# Patient Record
Sex: Female | Born: 1945
Health system: Southern US, Community
[De-identification: ages and names within clinical notes are randomized; demographics above are authoritative.]

## PROBLEM LIST (undated history)

## (undated) DIAGNOSIS — L661 Lichen planopilaris, unspecified: Secondary | ICD-10-CM

## (undated) DIAGNOSIS — M5136 Other intervertebral disc degeneration, lumbar region: Secondary | ICD-10-CM

## (undated) DIAGNOSIS — E079 Disorder of thyroid, unspecified: Secondary | ICD-10-CM

## (undated) DIAGNOSIS — L439 Lichen planus, unspecified: Secondary | ICD-10-CM

## (undated) DIAGNOSIS — M1990 Unspecified osteoarthritis, unspecified site: Secondary | ICD-10-CM

## (undated) DIAGNOSIS — L900 Lichen sclerosus et atrophicus: Secondary | ICD-10-CM

## (undated) DIAGNOSIS — M51369 Other intervertebral disc degeneration, lumbar region without mention of lumbar back pain or lower extremity pain: Secondary | ICD-10-CM

## (undated) DIAGNOSIS — J45909 Unspecified asthma, uncomplicated: Secondary | ICD-10-CM

## (undated) HISTORY — PX: SINUSOTOMY: SHX291

## (undated) HISTORY — PX: DILATION AND CURETTAGE OF UTERUS: SHX78

---

## 1998-02-16 DIAGNOSIS — H9190 Unspecified hearing loss, unspecified ear: Secondary | ICD-10-CM | POA: Insufficient documentation

## 2005-10-13 ENCOUNTER — Other Ambulatory Visit: Admission: RE | Admit: 2005-10-13 | Discharge: 2005-10-13 | Payer: Self-pay | Admitting: *Deleted

## 2005-11-03 ENCOUNTER — Encounter: Admission: RE | Admit: 2005-11-03 | Discharge: 2005-11-03 | Payer: Self-pay | Admitting: *Deleted

## 2005-12-01 ENCOUNTER — Encounter: Admission: RE | Admit: 2005-12-01 | Discharge: 2005-12-01 | Payer: Self-pay | Admitting: *Deleted

## 2006-11-08 ENCOUNTER — Encounter: Admission: RE | Admit: 2006-11-08 | Discharge: 2006-11-08 | Payer: Self-pay | Admitting: *Deleted

## 2007-02-22 ENCOUNTER — Encounter: Admission: RE | Admit: 2007-02-22 | Discharge: 2007-02-22 | Payer: Self-pay | Admitting: Gastroenterology

## 2007-11-22 ENCOUNTER — Encounter: Admission: RE | Admit: 2007-11-22 | Discharge: 2007-11-22 | Payer: Self-pay | Admitting: Family Medicine

## 2007-11-26 ENCOUNTER — Inpatient Hospital Stay (HOSPITAL_COMMUNITY): Admission: EM | Admit: 2007-11-26 | Discharge: 2007-11-27 | Payer: Self-pay | Admitting: Emergency Medicine

## 2008-11-29 ENCOUNTER — Encounter: Admission: RE | Admit: 2008-11-29 | Discharge: 2008-11-29 | Payer: Self-pay | Admitting: Obstetrics

## 2009-09-03 IMAGING — CR DG SHOULDER 2+V*R*
3 series · 3 of 3 positions shown · non-contrast
Comparison: Two-view chest obtained at the same time.

CLINICAL DATA: Right shoulder pain following a fall down steps.

RIGHT SHOULDER - 2+ VIEW

[w shoulder ap internal righ]
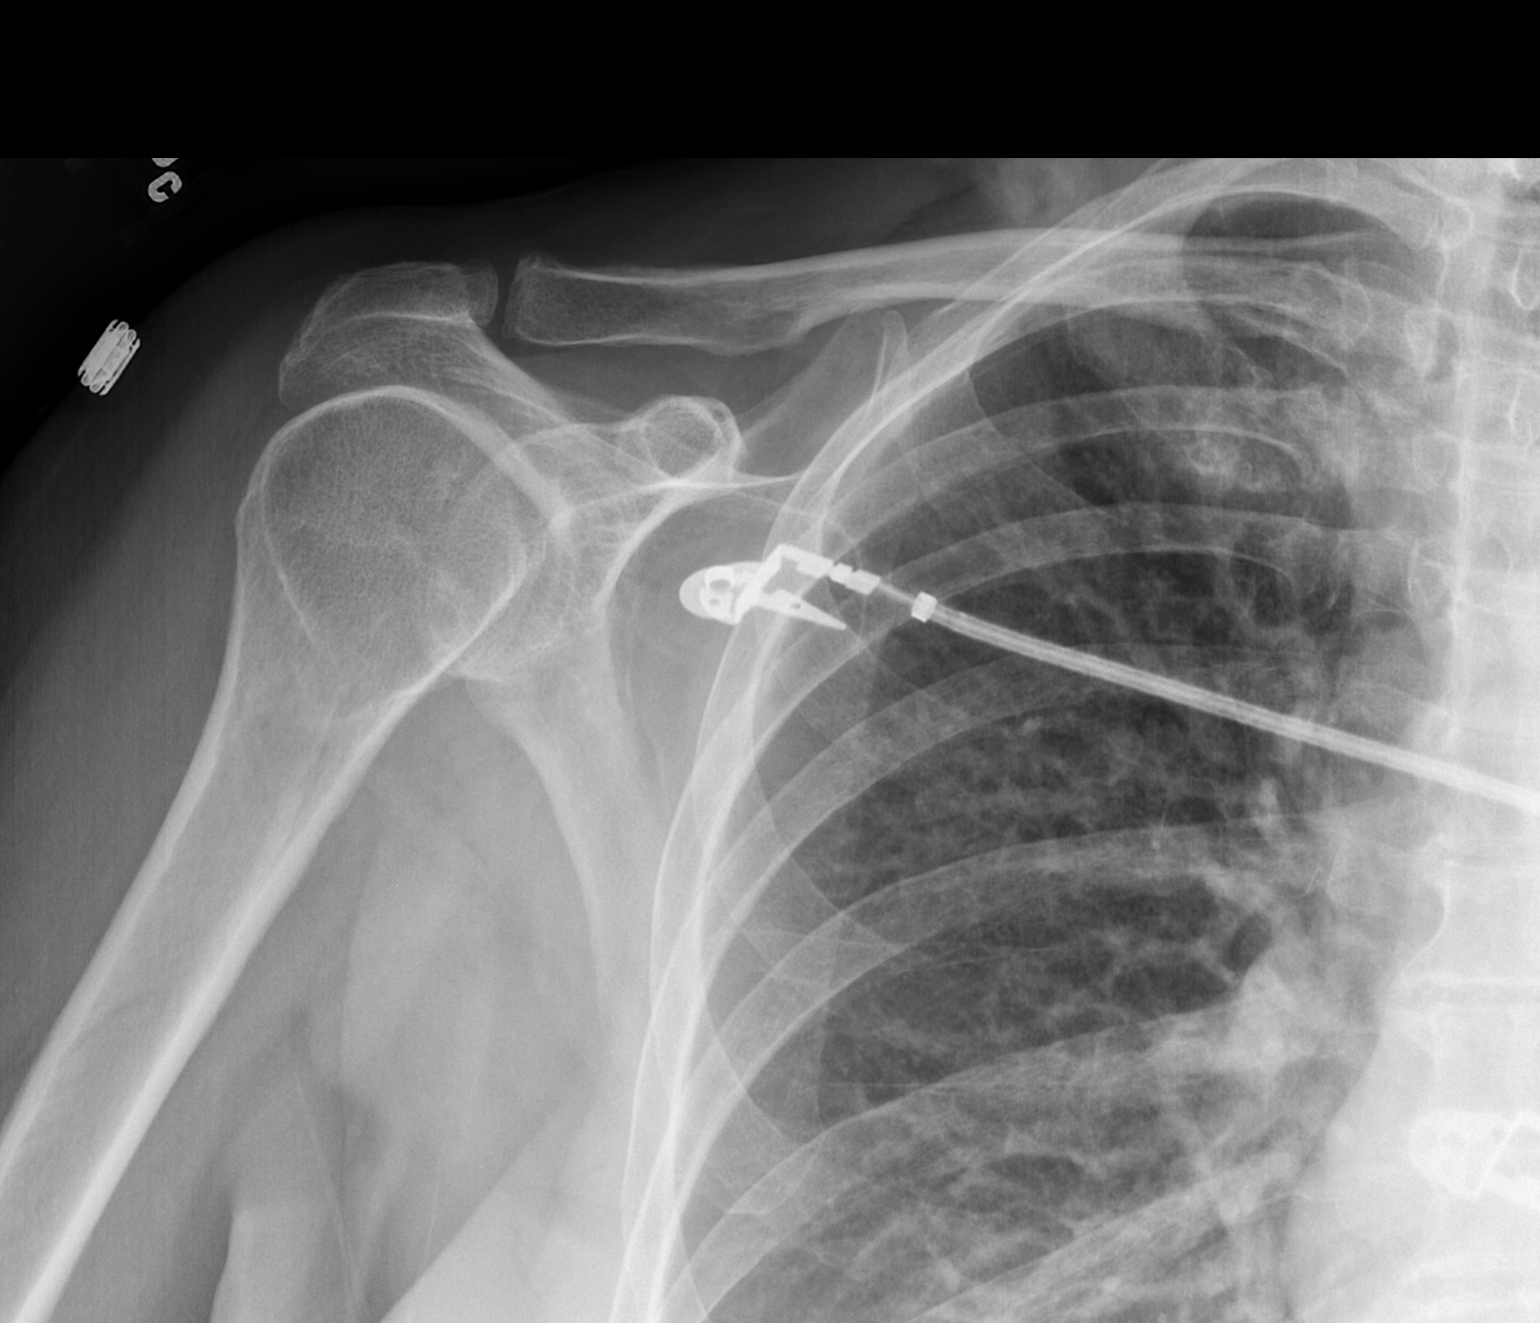

[w shoulder ap external righ]
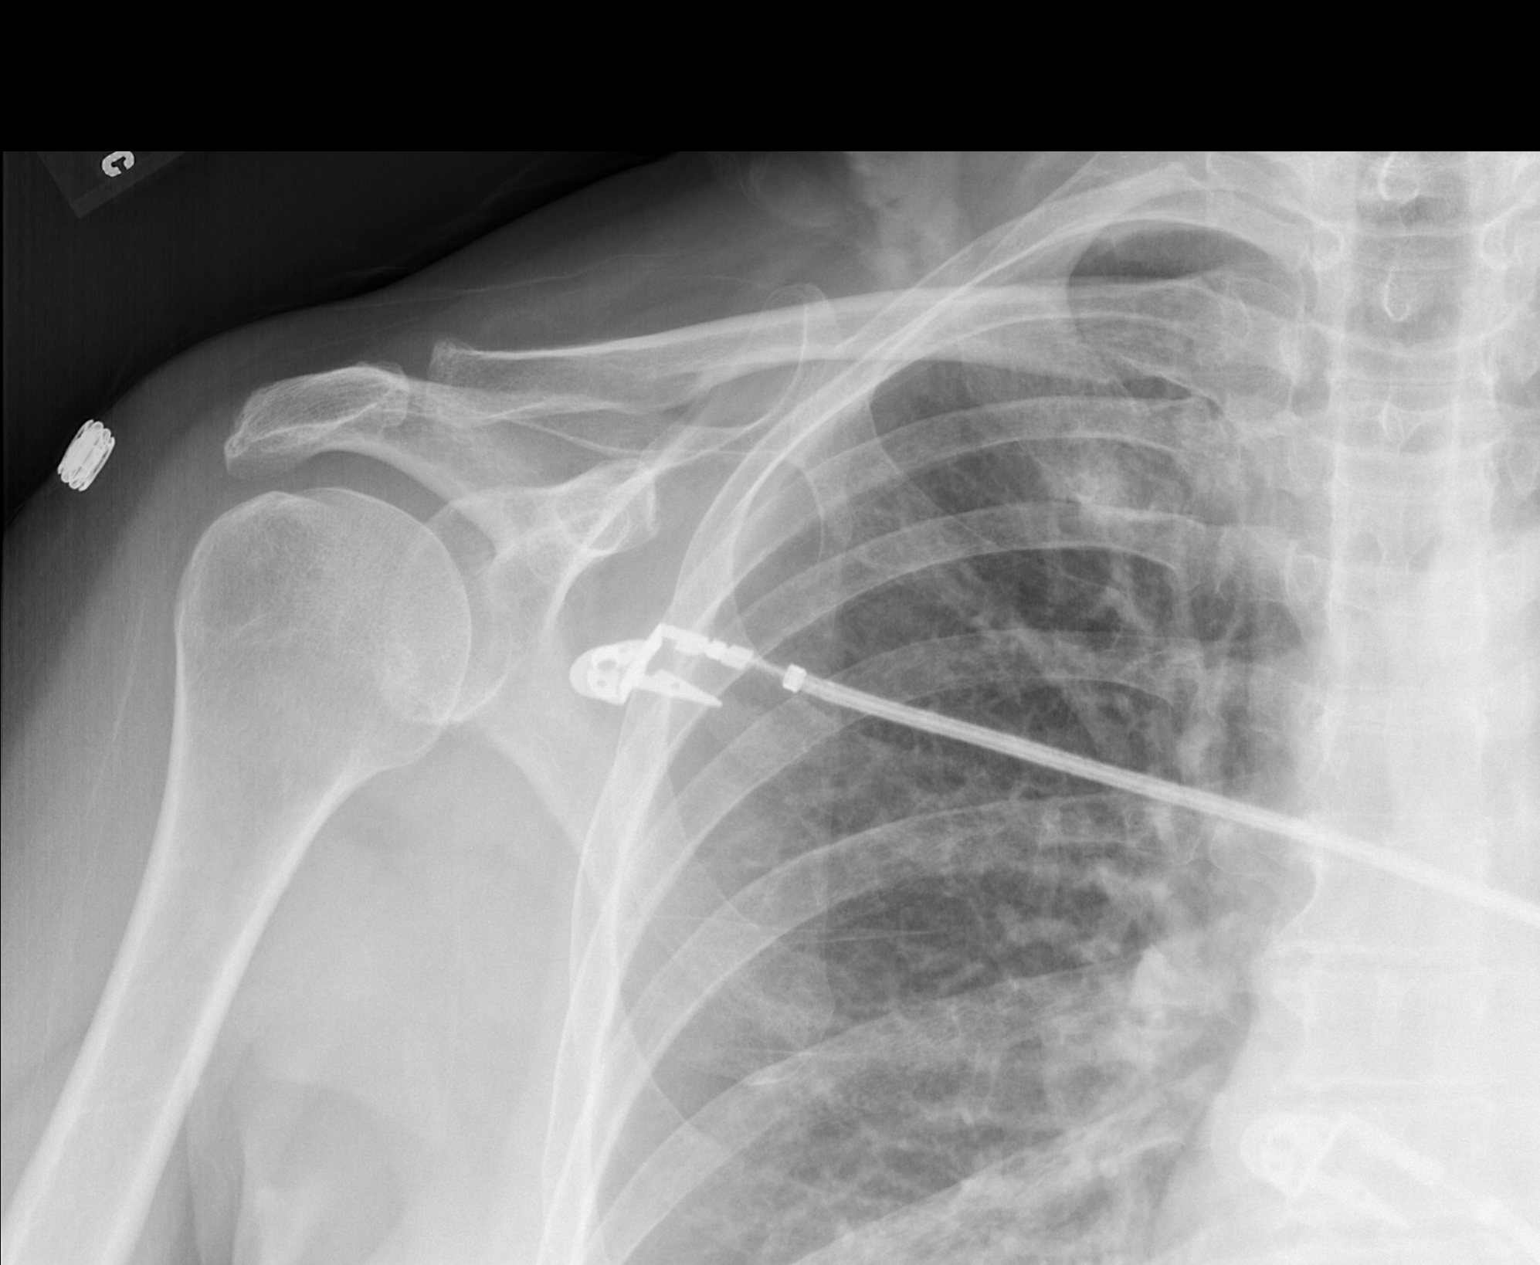

[w shoulder y view right]
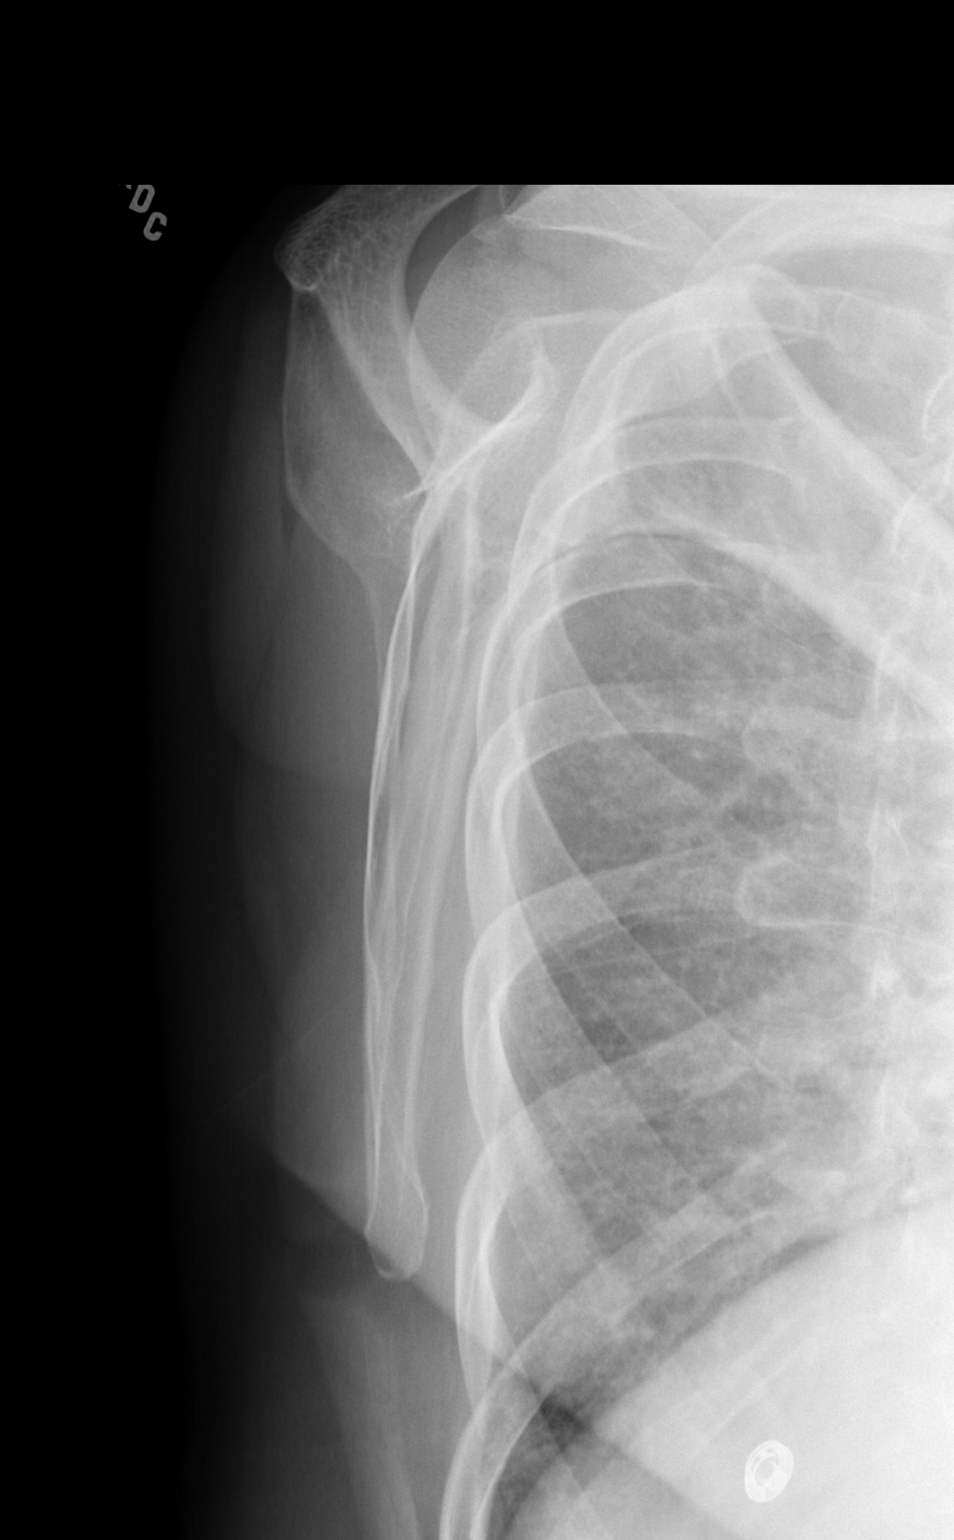

[3 of 3 positions shown; findings below may reference images not displayed]

FINDINGS: Normal appearing bones and soft tissues with no fracture
or dislocation seen.
IMPRESSION: No fracture or dislocation.

## 2009-09-03 IMAGING — CR DG HAND COMPLETE 3+V*L*
3 series · 3 of 3 positions shown · non-contrast
Comparison: None.

CLINICAL DATA: Left hand pain after falling down stairs.

LEFT HAND - COMPLETE 3+ VIEW

[w hand pa left *]
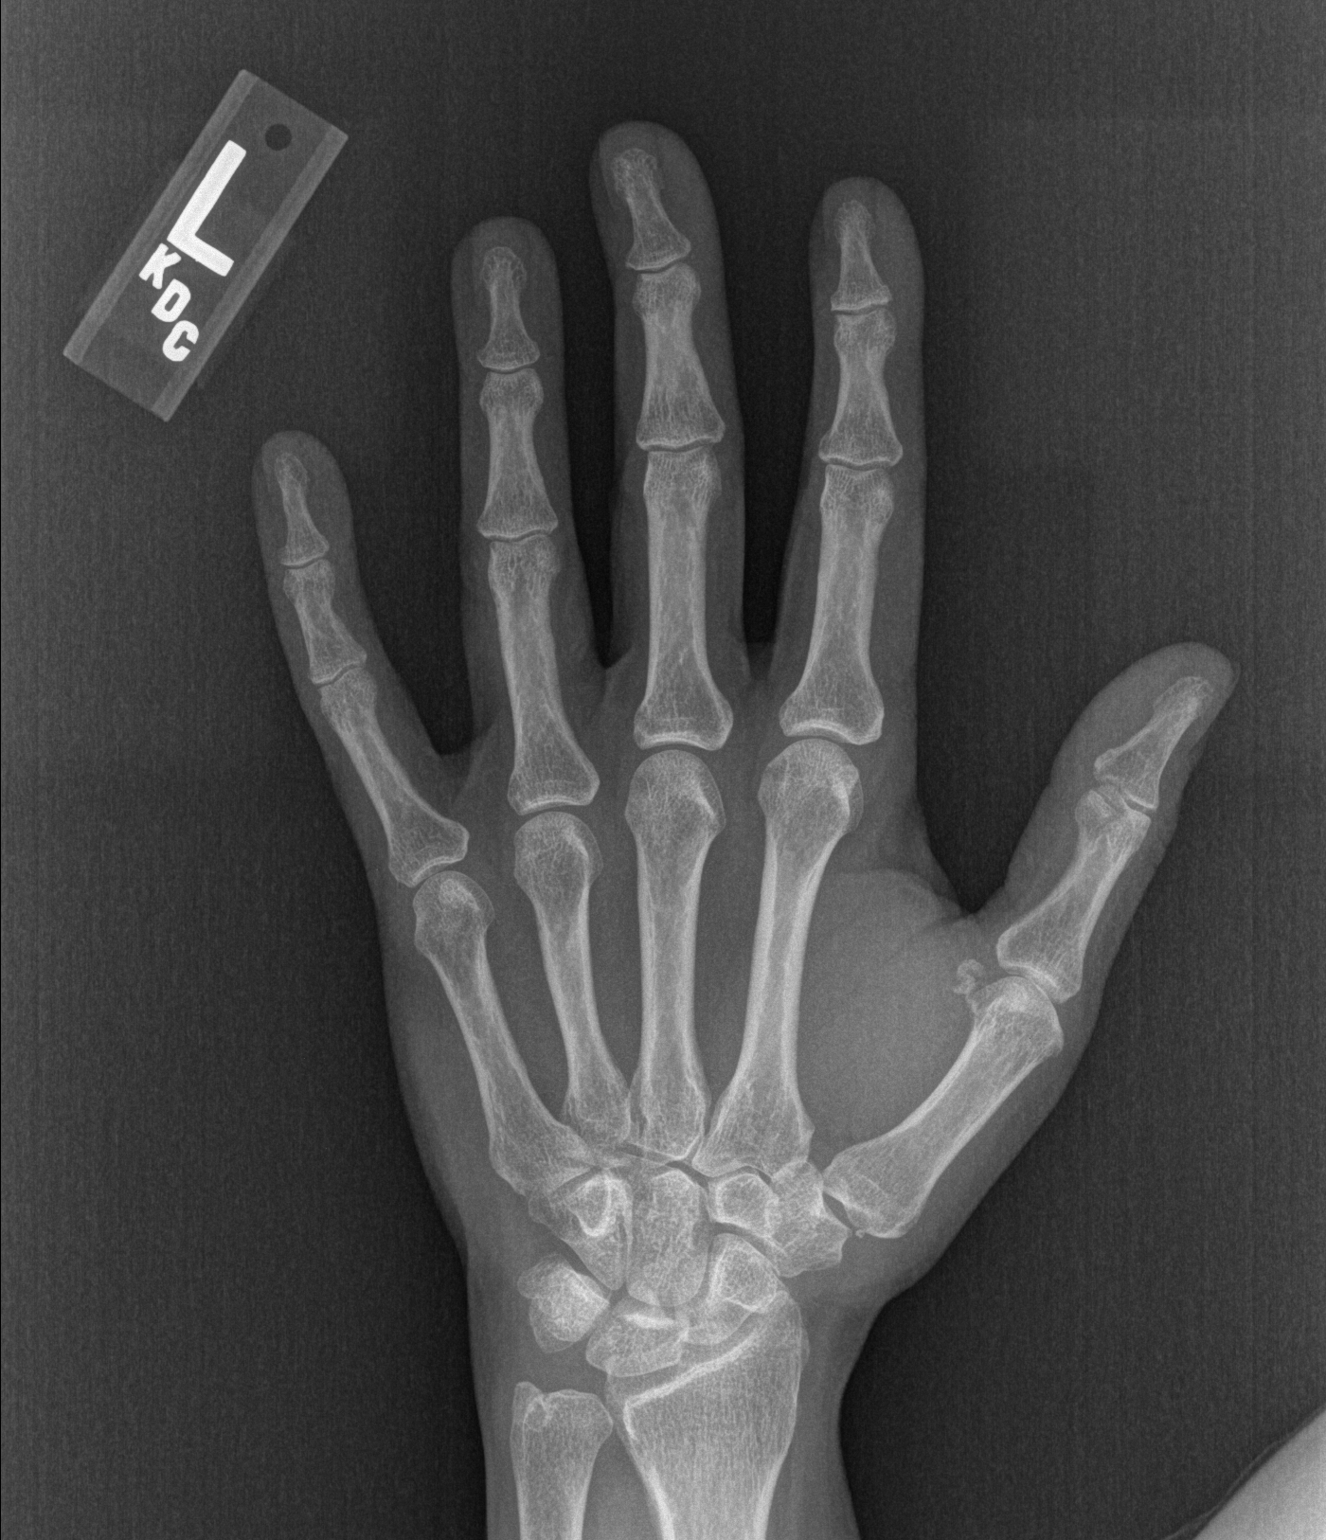

[w hand oblique left *]
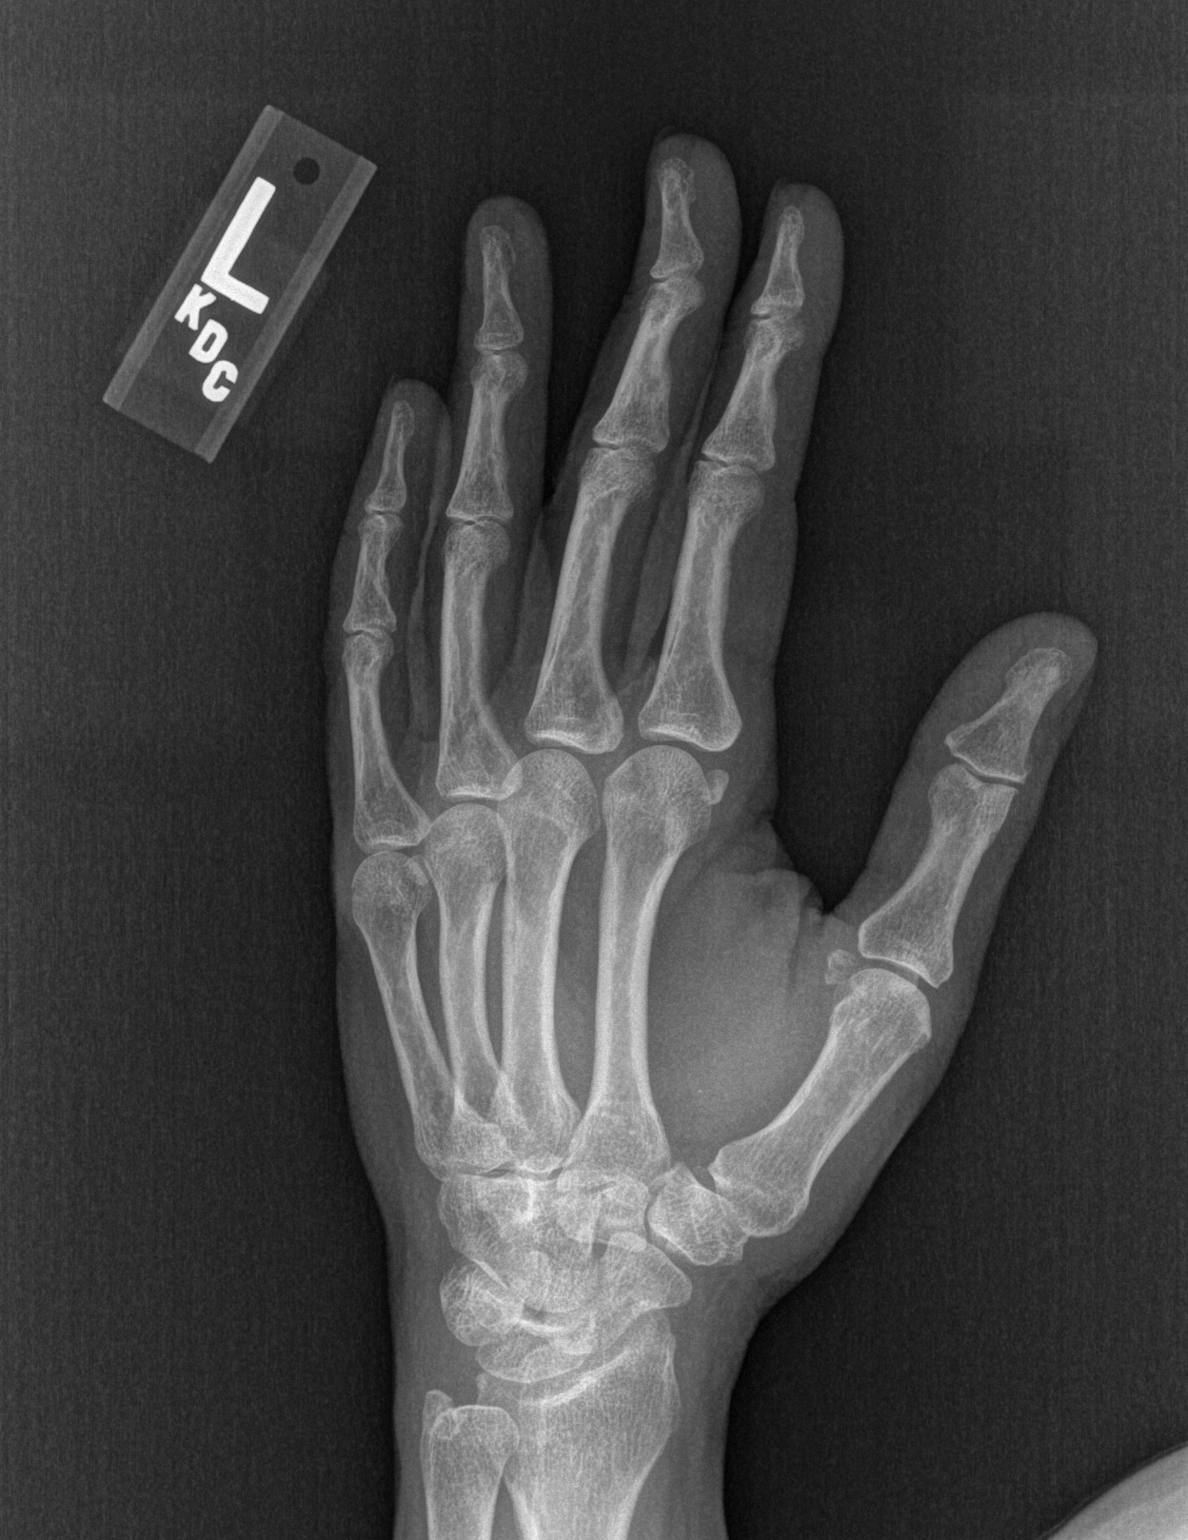

[w hand lat left *]
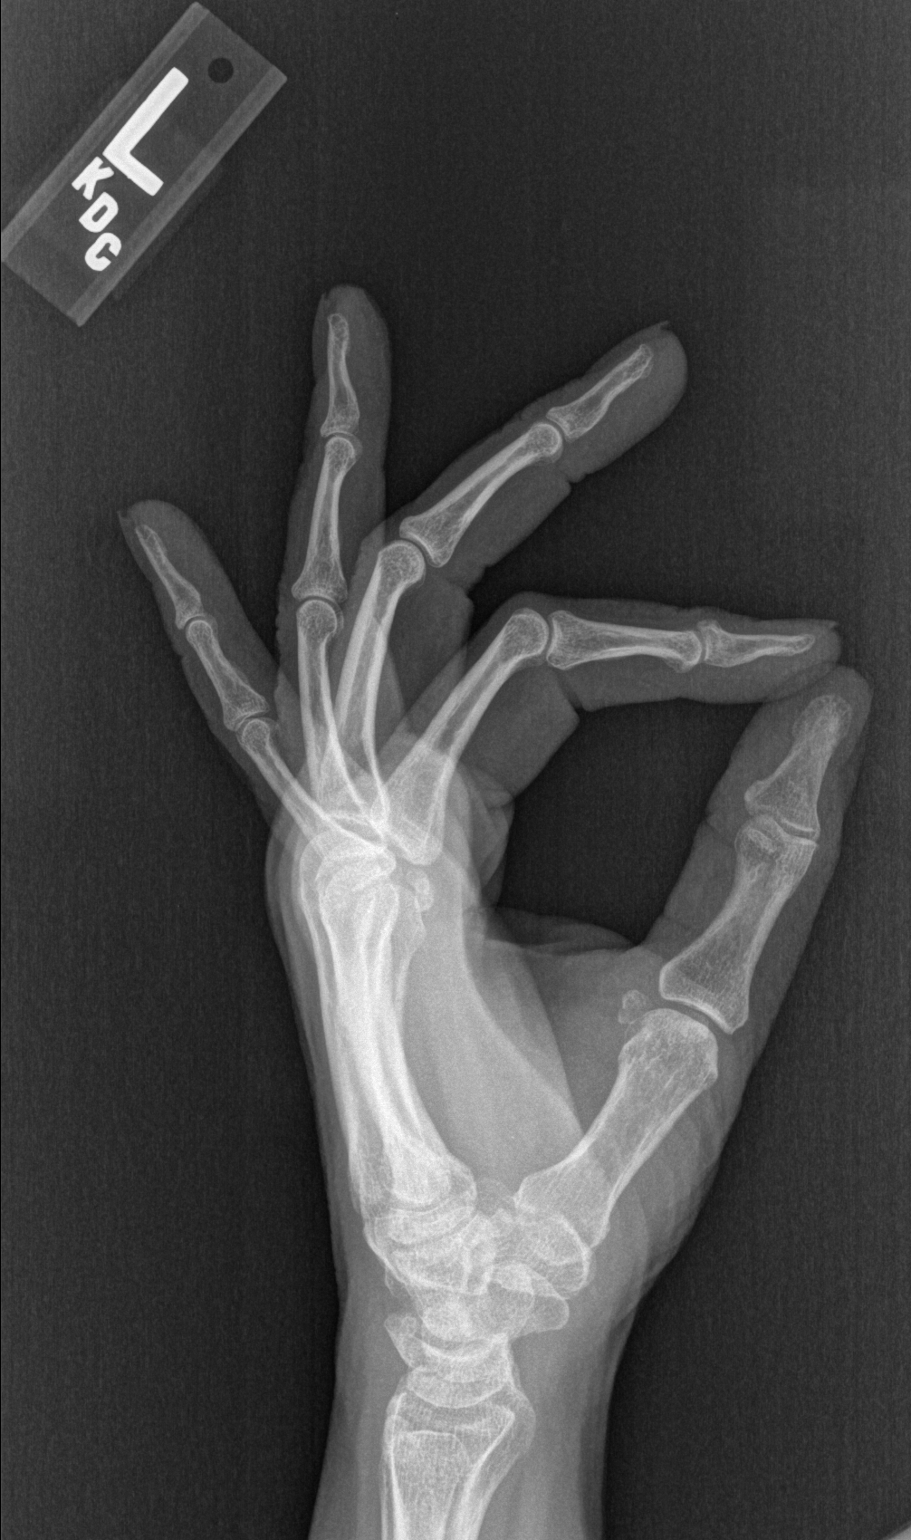

[3 of 3 positions shown; findings below may reference images not displayed]

FINDINGS: Diffuse osteopenia.  A dorsal carpal avulsion fracture is
difficult to exclude on the lateral view, due to overlapping bones
and osteopenia.  Otherwise, no fracture or dislocation is seen.
IMPRESSION: A dorsal carpal avulsion fracture cannot be excluded.  Correlation
with presence or absence of pain at that location is necessary.
Otherwise, no fracture seen.

## 2009-12-02 ENCOUNTER — Encounter: Admission: RE | Admit: 2009-12-02 | Discharge: 2009-12-02 | Payer: Self-pay | Admitting: Family Medicine

## 2010-02-06 ENCOUNTER — Encounter
Admission: RE | Admit: 2010-02-06 | Discharge: 2010-02-06 | Payer: Self-pay | Source: Home / Self Care | Attending: Obstetrics | Admitting: Obstetrics

## 2010-03-08 ENCOUNTER — Encounter: Payer: Self-pay | Admitting: *Deleted

## 2010-03-09 ENCOUNTER — Encounter: Payer: Self-pay | Admitting: *Deleted

## 2010-07-01 NOTE — H&P (Signed)
Melissa Craig, ULIBARRI              ACCOUNT NO.:  [id number]   MEDICAL RECORD NO.:  [id number]          PATIENT TYPE:  INP   LOCATION:  3738                         FACILITY:  [facility]   PHYSICIAN:  Kela Millin, M.D.DATE OF BIRTH:  [birth year]   DATE OF ADMISSION:  11/26/2007  DATE OF DISCHARGE:                              HISTORY & PHYSICAL   PRIMARY CARE PHYSICIAN:  Dwana Curd. Para March, M.D.   CHIEF COMPLAINT:  Syncope and status post fall.   HISTORY OF PRESENT ILLNESS:  The patient is a 65 year old white female  with history of asthma and hypothyroidism who presents with the above  complaints.  She states that she was on the phone talking to a friend  and the next thing she knew she was waking up on the floor at the bottom  of the staircase.  She has no memory of what actually happened, but  after she awakened on the floor and found that she had fallen, she was  able to reach her phone and called the neighbors. The friend she was  talking to on the phone when this happened reports that they were just  talking and the next thing she knew the patient was quiet and then she  heard a loud noise.  They estimate that she was unconscious just for  seconds because of the time the neighbor reports that she called her  after the fall.  Her friends report that she appeared to be disoriented  for about 20 to 25 minutes after the fall. No urinary or stool  incontinence reported.  She denies chest pain, dizziness, fevers, cough,  palpitations, focal weakness, headaches, and no blurry vision.  She  sustained some lacerations to her face following the fall and was  bleeding from her forehead upon arrival in the ER, also some lacerations  to her lower lip.   Upon arrival in the ER, a CT scan of her head was done which showed no  skull fracture or intracranial hemorrhage, mild chronic bilateral  ethmoid, frontal and sphenoid sinusitis noted.  CT scan of cervical  spine also done with no fracture  or subluxation.  She also was  complaining of pain and discoloration in her left thumb area and an x-  ray was done and per radiologist a dorsal carpal avulsion fracture  cannot be excluded.  A right shoulder x-ray was also done with no  fracture or dislocation.  ENT was consulted for the facial lacerations  and Dr. Lazarus Salines saw the patient and sutured the facial lacerations.  The  trauma surgery was also consulted and they saw the patient and following  evaluation recommended that she be admitted to the medicine service.   PAST MEDICAL HISTORY:  As above.   MEDICATIONS:  1. Synthroid 0.112 mg daily.  2. Asmanex daily.  3. Nasonex daily.  4. Multivitamin.  5. Citracal Plus D.   ALLERGIES:  AMPICILLIN, GENTAMICIN, SULFA, PENICILLIN.   SOCIAL HISTORY:  She quit tobacco in 1982.  She drinks a glass of wine  daily.   FAMILY HISTORY:  Mother had a stroke.   REVIEW  OF SYSTEMS:  As per HPI, other review of systems negative.   PHYSICAL EXAMINATION:  GENERAL:  The patient is an older white female.  She is alert and oriented in no apparent distress.  VITAL SIGNS:  Initial blood pressure 113/61, dropped to 96/64 after she  was given morphine in the ER.  Pulse 77, respiratory rate 16, O2  saturation 98%.  HEENT:  Facial lacerations over her nasal bridge as well as laceration  on her lower lip, status post sutures,  bruising on her chin as well.  NECK:  Supple.  No adenopathy, no thyromegaly, and no JVD.  LUNGS:  Clear to auscultation bilaterally.  No crackles or wheezes.  CARDIOVASCULAR:  Regular rate and rhythm.  Normal S1 and S2.  ABDOMEN:  Soft.  Bowel sounds present. Nontender, nondistended.  No  organomegaly and no masses palpable.  EXTREMITIES:  No cyanosis and no edema.  NEUROLOGIC:  She is alert and oriented x3.  Cranial nerves II-XII  grossly intact.  Strength was 4-5/5 and symmetric.  Plantar reflexes:  Her toes downgoing bilaterally.  Sensory grossly intact.   LABORATORY  DATA:  The CT scan of her head and the other imaging studies  as per HPI.  A white cell count is 11 with a hemoglobin of 12.9,  hematocrit of 38.2, platelet count 237,000.  Sodium 135 with a potassium  of 4.2, chloride 103, CO2 27, glucose 166, BUN 17, creatinine 0.89,  calcium 9.  Point-of-care markers negative x1.   ASSESSMENT AND PLAN:  1. Syncope, status post fall with facial lacerations as discussed      above.  CT scan of head negative for acute intracranial findings      and also CT of neck negative for fracture.  The patient seen by      trauma surgery as well as ENT as discussed above.  Will obtain      orthostatic vital signs, carotid Doppler ultrasound, 2-D echo and      follow.  2. Facial lacerations, status post suturing per ENT.  Follow up at his      office in 7 to 8 days as directed, local wound care as instructed.  3. Mild sinusitis.  Continue Flonase.  4. Left hand bruising.  Hand x-ray reviewed per trauma surgery and      they indicate no fracture.  5. Hypothyroidism.  Continue Synthroid.  6. History of asthma.  Continue outpatient medications.      Kela Millin, M.D.  Electronically Signed     ACV/MEDQ  D:  11/26/2007  T:  11/27/2007  Job:  [id number]   cc:   Dwana Curd. Para March, M.D.

## 2010-07-01 NOTE — Consult Note (Signed)
Melissa Craig, HANLINE NO.:  [id number]   MEDICAL RECORD NO.:  [id number]          PATIENT TYPE:  INP   LOCATION:  1828                         FACILITY:  [facility]   PHYSICIAN:  Sandria Bales. Ezzard Standing, M.D.  DATE OF BIRTH:  01-30-1946   DATE OF CONSULTATION:  DATE OF DISCHARGE:                                 CONSULTATION   Date of consultation ??   REFERRING PHYSICIAN:  [name], M.D.   REASON FOR REFERRAL:  Potential trauma patient with fall, laceration across bridge of nose,  questionable closed head injury.   HISTORY OF PRESENT ILLNESS:  This is a 65 year old white female who is a  patient of Dr. Conard Craig, [facility], who was at  home today talking on the phone to her niece when she passed out.  Apparently she fell down some steps, sustained a laceration at the  bridge of her nose, was brought in to the emergency room.  On arrival at  the emergency room she apparently was alert, oriented, with stable vital  signs, though she has amnesia for immediately before and after the  accident.  She was not coded as a trauma patient.  Her GCS is 15.   She denies any history of previous neurologic events.  She has had no  history of seizure, no loss of consciousness, no previous syncopal  episodes.  She is also not sure whether she tripped and fell versus  syncope.  She has amnesia for the event.   PAST MEDICAL HISTORY:   ALLERGIES:  1. PENICILLIN.  She says it made  her itch, last used over 30 years      ago.  2. GENTAMICIN which she used for her sinuses.  3. NIACIN which she is unsure what that is.  4. SULFA which was last used about 10 years ago.  Itching.   CURRENT MEDICATIONS:  1. Synthroid 112 mcg daily.  2. Asmanex inhaler.  3. Nasonex.  4. She is on vitamins.   REVIEW OF SYSTEMS:  NEUROLOGIC:  See history of present illness.  PULMONARY:  No history of pneumonia.  She does have some mild asthma.  She has never been hospitalized for  this.  This is well-controlled with  her Asmanex.  CARDIAC:  She has had no heart disease, chest pain, or  hypertension.  She has had no cardiac catheterization.  GASTROINTESTINAL:  No history of peptic ulcer disease or liver disease  or pancreatic disease.  UROLOGIC:  She has tendency to kidney infections.  MUSCULOSKELETAL:  She has had no previous injuries or fractures.   She is accompanied in the room with her niece, who she apparently was  talking on the phone with.  She is single and divorced many years ago.  She does not have children.  She is retired from [employer] working for [employer] and [employer].   PHYSICAL EXAMINATION:  VITAL SIGNS:  On physical exam her temperature is  97.9, blood pressure 113/61, pulse 58, respirations are 18.  GENERAL:  She is a well-nourished white female who is alert and  cooperative  and talking and oriented x3.  She has no mental sequelae  from the syncopal episode.  HEENT:  She has a 4 cm laceration about the level of eyebrow at the  bridge of her nose.  Her pupils are equal and reactive to light.  She has gross vision  intact.  Her nose appears to be midline.  Teeth:  There are no obvious  fractures of the teeth, though she does have an abrasion/cut to her  upper lip and lower lip.  Her tympanic membranes are unremarkable as are  her auditory canals.  EXTREMITIES:  She complains of right shoulder soreness without obvious  fracture.  She had a little bruise over her left hand and thumb but no  point tenderness.  She has good strength in upper and lower extremities  and no obvious lower extremity injury.  NECK:  She has no pain on palpation of her neck or moving her neck.  Her  thoracic and lumbar spines are also negative.  LUNGS:  Her lungs are clear to auscultation with symmetric breath  sounds.  HEART:  Has a regular rate and rhythm without murmur or rub.  ABDOMEN:  Soft.  She has no tenderness nor obvious contusion, bruise,  or  injury.  EXTREMITIES:  She has no obvious long bone injury. She does have the  right shoulder soreness.   I reviewed her CT scans of her head and face with Dr. Winferd Humphrey, head  and neck and these showed some mild cerebral atrophy, degenerative  disease over anterior cervical spine, but no obvious skull/neck fracture  or injury to her brain.   LABORATORY DATA:  Her labs show a hemoglobin of 12.9, hematocrit 38.2,  white blood count 11,000, platelet count 237,000.  Her sodium was 135,  potassium 4.2, CO2 27, creatinine 0.89.   Her shoulder films on the right side were negative.  Her left hand films  were negative for any obvious fracture.   IMPRESSION:  1. Closed head injury, loss of consciousness.  Plan to be admitted by      internal medicine for evaluation of her syncope.  2. Laceration of the bridge of her nose and upper and lower lips for      evaluation by Dr. Flo Shanks.  3. Right shoulder soreness with no obvious fracture.  4. Left hand bruise, no obvious fracture.  5. Mild asthma, well-controlled.  6. Thyroid replacement.  7. She was planning to travel with her friend [name] to [place] tomorrow.      Sandria Bales. Ezzard Standing, M.D.  Electronically Signed     DHN/MEDQ  D:  11/26/2007  T:  11/26/2007  Job:  [id number]   cc:   Brett Albino, M.D.  Gloris Manchester. Lazarus Salines, M.D.  Roselyn M. Willa Rough, M.D.  Jettie Pagan, MD

## 2010-07-01 NOTE — Consult Note (Signed)
NAMEKRYSTELLE, PRASHAD NO.:  [id number]   MEDICAL RECORD NO.:  [id number]          PATIENT TYPE:  INP   LOCATION:  3738                         FACILITY:  [facility]   PHYSICIAN:  Zola Button T. Lazarus Salines, M.D. DATE OF BIRTH:  06-Apr-1945   DATE OF CONSULTATION:  11/26/2007  DATE OF DISCHARGE:                                 CONSULTATION   CHIEF COMPLAINT:  Facial injuries.   HISTORY:  This 65 year old white female was walking down the stairs and  went up to the bottom with no recollection of how that happened.  She  sustained lacerations of the brow, upper and lower lips.  CT scan of the  head shows no acute fractures.  There are changes consistent with  chronic sinus disease and prior surgery.  No intracranial findings.  ENT  was called for assistance in closing lacerations.  The patient is not  clear as to whether she fainted.  She has no history of cardiac  arrhythmias, hypoglycemia, or any neurologic events.   PAST MEDICAL HISTORY:  She is allergic to AMPICILLIN, GENTAMICIN, and  SULFA.  She has history of asthma and thyroid problems.  Medications  include Asmanex, Synthroid, Citracal, Nasonex, Elestat, and Naproxen.  She has had 1 prior sinus surgery.   SOCIAL HISTORY:  She was on schedule to fly to [place], [place] for a  vacation tomorrow.   REVIEW OF SYSTEMS:  Noncontributory except as above.   PHYSICAL EXAMINATION:  This is a pleasant middle-aged white female who  appears uncomfortable.  Mental status is basically intact.  She has a  transverse full-thickness 6-cm laceration basically from under the right  eyebrow to under the left eyebrow medially.  This is a shelving  laceration down to the bone/periosteum with local disruption of the  frontalis muscle.  A small laceration of the midline upper lip skin.  An  H-configuration laceration of the lower lip across the vermilion border.  Periorbital ecchymoses.  Intact vision both eyes.  I did not examine  ears,  internal nose, or mouth/throat.   IMPRESSION:  Several facial lacerations status post a fall, (?) loss of  consciousness.   PLAN:  With informed consent, I anesthetized the various wounds with 1%  Xylocaine with 1:100,000 epinephrine, 14 mL total.  After allowing time  for this to take effect, a sterile preparation and draping with a 50:50  mixture of Betadine and saline was performed.  Hemostasis was  spontaneous.   Beginning in the upper lip, the laceration was closed with interrupted 5-  0 Ethilon stitches.  The lower lip was reapproximated with the same  agent at this vermilion border and then down onto the skin of the lip.  The mucosa of the lip was closed with interrupted 5-0 chromic sutures.   Returning to the forehead, the depth of the wound was explored.  There  was exposed bone/periosteum.  The flap frontalis muscle was  reapproximated across the soft tissue defect using interrupted 4-0  Vicryl stitches.  The shelving laceration was brought downward with the  same agent.  Finally, the subcutaneous layer was closed with  interrupted  4-0 Vicryl sutures.  Finally, the skin itself was closed in a cosmetic  fashion with a running simple 5-0 Ethilon.  The patient tolerated the  procedure nicely.  A good cosmetic reconstruction was accomplished.   I talked with her about ice, elevation, analgesia, and wound hygiene  measures.  I will remove the sutures in 7-9 days in my office.  For now,  we will use ice and elevation.  She understands and agrees with the  discussion and plans.      Gloris Manchester. Lazarus Salines, M.D.  Electronically Signed     KTW/MEDQ  D:  11/26/2007  T:  11/26/2007  Job:  [id number]

## 2010-11-18 LAB — BASIC METABOLIC PANEL
GFR calc Af Amer: >60
GFR calc non Af Amer: >60
Potassium: 4.2
Sodium: 135

## 2010-11-18 LAB — CBC
HCT: 35.1 — ABNORMAL LOW
HCT: 38.2
Hemoglobin: 11.7 — ABNORMAL LOW
Hemoglobin: 12.9
MCHC: 33.9
MCV: 96.9
MCV: 97.4
Platelets: 214
Platelets: 237
RBC: 3.61 — ABNORMAL LOW
RBC: 3.94
RDW: 13.1
WBC: 11 — ABNORMAL HIGH
WBC: 7.8

## 2010-11-18 LAB — DIFFERENTIAL
Basophils Absolute: 0.1
Basophils Relative: 1
Eosinophils Absolute: 0.2
Eosinophils Relative: 2
Lymphocytes Relative: 9 — ABNORMAL LOW
Lymphs Abs: 1
Monocytes Absolute: 0.7
Monocytes Relative: 6
Neutro Abs: 9.1 — ABNORMAL HIGH
Neutrophils Relative %: 83 — ABNORMAL HIGH

## 2010-11-18 LAB — CARDIAC PANEL(CRET KIN+CKTOT+MB+TROPI)
Relative Index: 0.9
Troponin I: 0.01
Troponin I: 0.02

## 2010-11-18 LAB — TROPONIN I: Troponin I: 0.01

## 2010-11-18 LAB — CK TOTAL AND CKMB (NOT AT ARMC)
CK, MB: 5 — ABNORMAL HIGH
Relative Index: 1.8
Total CK: 272 — ABNORMAL HIGH

## 2010-11-18 LAB — POCT CARDIAC MARKERS
CKMB, poc: 3
Myoglobin, poc: 297
Troponin i, poc: <0.05

## 2010-11-18 LAB — BASIC METABOLIC PANEL WITH GFR
BUN: 17
CO2: 27
Calcium: 9
Chloride: 103
Creatinine, Ser: 0.89
Glucose, Bld: 166 — ABNORMAL HIGH

## 2010-11-28 ENCOUNTER — Other Ambulatory Visit: Payer: Self-pay | Admitting: Family Medicine

## 2010-11-28 DIAGNOSIS — Z1231 Encounter for screening mammogram for malignant neoplasm of breast: Secondary | ICD-10-CM

## 2010-12-09 ENCOUNTER — Ambulatory Visit
Admission: RE | Admit: 2010-12-09 | Discharge: 2010-12-09 | Disposition: A | Discharge status: Home / Self Care | Payer: Federal, State, Local not specified - PPO | Source: Ambulatory Visit | Attending: Family Medicine | Admitting: Family Medicine

## 2010-12-09 ENCOUNTER — Ambulatory Visit: Payer: Self-pay

## 2010-12-09 DIAGNOSIS — Z1231 Encounter for screening mammogram for malignant neoplasm of breast: Secondary | ICD-10-CM

## 2011-09-07 DIAGNOSIS — H43819 Vitreous degeneration, unspecified eye: Secondary | ICD-10-CM | POA: Diagnosis not present

## 2011-09-07 DIAGNOSIS — H259 Unspecified age-related cataract: Secondary | ICD-10-CM | POA: Diagnosis not present

## 2011-09-07 DIAGNOSIS — H04129 Dry eye syndrome of unspecified lacrimal gland: Secondary | ICD-10-CM | POA: Diagnosis not present

## 2011-09-07 DIAGNOSIS — H3530 Unspecified macular degeneration: Secondary | ICD-10-CM | POA: Diagnosis not present

## 2011-11-24 ENCOUNTER — Other Ambulatory Visit: Payer: Self-pay | Admitting: Family Medicine

## 2011-11-24 DIAGNOSIS — Z1231 Encounter for screening mammogram for malignant neoplasm of breast: Secondary | ICD-10-CM

## 2011-12-21 ENCOUNTER — Ambulatory Visit
Admission: RE | Admit: 2011-12-21 | Discharge: 2011-12-21 | Disposition: A | Discharge status: Home / Self Care | Payer: Federal, State, Local not specified - PPO | Source: Ambulatory Visit | Attending: Family Medicine | Admitting: Family Medicine

## 2011-12-21 DIAGNOSIS — Z1231 Encounter for screening mammogram for malignant neoplasm of breast: Secondary | ICD-10-CM

## 2012-01-21 DIAGNOSIS — Z9189 Other specified personal risk factors, not elsewhere classified: Secondary | ICD-10-CM | POA: Diagnosis not present

## 2012-01-21 DIAGNOSIS — A6000 Herpesviral infection of urogenital system, unspecified: Secondary | ICD-10-CM | POA: Diagnosis not present

## 2012-01-21 DIAGNOSIS — Z01419 Encounter for gynecological examination (general) (routine) without abnormal findings: Secondary | ICD-10-CM | POA: Diagnosis not present

## 2012-01-21 DIAGNOSIS — Z23 Encounter for immunization: Secondary | ICD-10-CM | POA: Diagnosis not present

## 2012-01-21 DIAGNOSIS — N952 Postmenopausal atrophic vaginitis: Secondary | ICD-10-CM | POA: Diagnosis not present

## 2012-01-21 DIAGNOSIS — L940 Localized scleroderma [morphea]: Secondary | ICD-10-CM | POA: Diagnosis not present

## 2012-03-17 DIAGNOSIS — J309 Allergic rhinitis, unspecified: Secondary | ICD-10-CM | POA: Diagnosis not present

## 2012-03-17 DIAGNOSIS — L570 Actinic keratosis: Secondary | ICD-10-CM | POA: Diagnosis not present

## 2012-03-17 DIAGNOSIS — E039 Hypothyroidism, unspecified: Secondary | ICD-10-CM | POA: Diagnosis not present

## 2012-03-17 DIAGNOSIS — B351 Tinea unguium: Secondary | ICD-10-CM | POA: Diagnosis not present

## 2012-03-17 DIAGNOSIS — Z136 Encounter for screening for cardiovascular disorders: Secondary | ICD-10-CM | POA: Diagnosis not present

## 2012-03-17 DIAGNOSIS — Z79899 Other long term (current) drug therapy: Secondary | ICD-10-CM | POA: Diagnosis not present

## 2012-03-17 DIAGNOSIS — J45909 Unspecified asthma, uncomplicated: Secondary | ICD-10-CM | POA: Diagnosis not present

## 2012-03-17 DIAGNOSIS — Z0000 Encounter for general adult medical examination without abnormal findings: Secondary | ICD-10-CM | POA: Diagnosis not present

## 2012-05-04 DIAGNOSIS — L439 Lichen planus, unspecified: Secondary | ICD-10-CM | POA: Diagnosis not present

## 2012-05-04 DIAGNOSIS — B351 Tinea unguium: Secondary | ICD-10-CM | POA: Diagnosis not present

## 2012-05-25 DIAGNOSIS — L658 Other specified nonscarring hair loss: Secondary | ICD-10-CM | POA: Diagnosis not present

## 2012-05-25 DIAGNOSIS — R21 Rash and other nonspecific skin eruption: Secondary | ICD-10-CM | POA: Diagnosis not present

## 2012-06-10 DIAGNOSIS — L439 Lichen planus, unspecified: Secondary | ICD-10-CM | POA: Diagnosis not present

## 2012-08-05 DIAGNOSIS — Z79899 Other long term (current) drug therapy: Secondary | ICD-10-CM | POA: Diagnosis not present

## 2012-08-05 DIAGNOSIS — L439 Lichen planus, unspecified: Secondary | ICD-10-CM | POA: Diagnosis not present

## 2012-09-01 DIAGNOSIS — L738 Other specified follicular disorders: Secondary | ICD-10-CM | POA: Diagnosis not present

## 2012-09-01 DIAGNOSIS — D2330 Other benign neoplasm of skin of unspecified part of face: Secondary | ICD-10-CM | POA: Diagnosis not present

## 2012-09-23 DIAGNOSIS — D126 Benign neoplasm of colon, unspecified: Secondary | ICD-10-CM | POA: Diagnosis not present

## 2012-09-23 DIAGNOSIS — Z86010 Personal history of colonic polyps: Secondary | ICD-10-CM | POA: Diagnosis not present

## 2012-09-23 DIAGNOSIS — K5730 Diverticulosis of large intestine without perforation or abscess without bleeding: Secondary | ICD-10-CM | POA: Diagnosis not present

## 2012-09-23 DIAGNOSIS — Z09 Encounter for follow-up examination after completed treatment for conditions other than malignant neoplasm: Secondary | ICD-10-CM | POA: Diagnosis not present

## 2012-10-03 DIAGNOSIS — H52209 Unspecified astigmatism, unspecified eye: Secondary | ICD-10-CM | POA: Diagnosis not present

## 2012-10-03 DIAGNOSIS — D2310 Other benign neoplasm of skin of unspecified eyelid, including canthus: Secondary | ICD-10-CM | POA: Diagnosis not present

## 2012-10-03 DIAGNOSIS — L439 Lichen planus, unspecified: Secondary | ICD-10-CM | POA: Diagnosis not present

## 2012-10-03 DIAGNOSIS — H04129 Dry eye syndrome of unspecified lacrimal gland: Secondary | ICD-10-CM | POA: Diagnosis not present

## 2012-10-03 DIAGNOSIS — H3530 Unspecified macular degeneration: Secondary | ICD-10-CM | POA: Diagnosis not present

## 2012-10-06 DIAGNOSIS — Z79899 Other long term (current) drug therapy: Secondary | ICD-10-CM | POA: Diagnosis not present

## 2012-10-06 DIAGNOSIS — L439 Lichen planus, unspecified: Secondary | ICD-10-CM | POA: Diagnosis not present

## 2012-10-06 DIAGNOSIS — L650 Telogen effluvium: Secondary | ICD-10-CM | POA: Diagnosis not present

## 2012-11-03 DIAGNOSIS — J329 Chronic sinusitis, unspecified: Secondary | ICD-10-CM | POA: Diagnosis not present

## 2012-11-18 DIAGNOSIS — Z23 Encounter for immunization: Secondary | ICD-10-CM | POA: Diagnosis not present

## 2012-11-22 DIAGNOSIS — D2310 Other benign neoplasm of skin of unspecified eyelid, including canthus: Secondary | ICD-10-CM | POA: Diagnosis not present

## 2012-11-24 ENCOUNTER — Other Ambulatory Visit: Payer: Self-pay

## 2012-11-24 DIAGNOSIS — Z1231 Encounter for screening mammogram for malignant neoplasm of breast: Secondary | ICD-10-CM

## 2012-11-25 DIAGNOSIS — J329 Chronic sinusitis, unspecified: Secondary | ICD-10-CM | POA: Diagnosis not present

## 2012-11-25 DIAGNOSIS — R040 Epistaxis: Secondary | ICD-10-CM | POA: Insufficient documentation

## 2012-11-25 DIAGNOSIS — J3489 Other specified disorders of nose and nasal sinuses: Secondary | ICD-10-CM | POA: Insufficient documentation

## 2012-11-25 DIAGNOSIS — Z8669 Personal history of other diseases of the nervous system and sense organs: Secondary | ICD-10-CM | POA: Diagnosis not present

## 2012-11-25 DIAGNOSIS — Z8709 Personal history of other diseases of the respiratory system: Secondary | ICD-10-CM | POA: Diagnosis not present

## 2012-12-22 ENCOUNTER — Ambulatory Visit
Admission: RE | Admit: 2012-12-22 | Discharge: 2012-12-22 | Disposition: A | Discharge status: Home / Self Care | Payer: Medicare Other | Source: Ambulatory Visit

## 2012-12-22 DIAGNOSIS — Z1231 Encounter for screening mammogram for malignant neoplasm of breast: Secondary | ICD-10-CM | POA: Diagnosis not present

## 2012-12-28 DIAGNOSIS — H9190 Unspecified hearing loss, unspecified ear: Secondary | ICD-10-CM | POA: Diagnosis not present

## 2012-12-28 DIAGNOSIS — J329 Chronic sinusitis, unspecified: Secondary | ICD-10-CM | POA: Diagnosis not present

## 2012-12-28 DIAGNOSIS — Z9889 Other specified postprocedural states: Secondary | ICD-10-CM | POA: Diagnosis not present

## 2012-12-28 DIAGNOSIS — J3489 Other specified disorders of nose and nasal sinuses: Secondary | ICD-10-CM | POA: Diagnosis not present

## 2012-12-28 DIAGNOSIS — R040 Epistaxis: Secondary | ICD-10-CM | POA: Diagnosis not present

## 2012-12-28 DIAGNOSIS — R0602 Shortness of breath: Secondary | ICD-10-CM | POA: Diagnosis not present

## 2013-01-04 DIAGNOSIS — Z79899 Other long term (current) drug therapy: Secondary | ICD-10-CM | POA: Diagnosis not present

## 2013-01-04 DIAGNOSIS — L439 Lichen planus, unspecified: Secondary | ICD-10-CM | POA: Diagnosis not present

## 2013-02-15 DIAGNOSIS — B356 Tinea cruris: Secondary | ICD-10-CM | POA: Diagnosis not present

## 2013-03-01 DIAGNOSIS — A6000 Herpesviral infection of urogenital system, unspecified: Secondary | ICD-10-CM | POA: Diagnosis not present

## 2013-03-01 DIAGNOSIS — Z9189 Other specified personal risk factors, not elsewhere classified: Secondary | ICD-10-CM | POA: Diagnosis not present

## 2013-03-01 DIAGNOSIS — N952 Postmenopausal atrophic vaginitis: Secondary | ICD-10-CM | POA: Diagnosis not present

## 2013-03-01 DIAGNOSIS — L940 Localized scleroderma [morphea]: Secondary | ICD-10-CM | POA: Diagnosis not present

## 2013-03-01 DIAGNOSIS — Z01419 Encounter for gynecological examination (general) (routine) without abnormal findings: Secondary | ICD-10-CM | POA: Diagnosis not present

## 2013-04-05 DIAGNOSIS — L439 Lichen planus, unspecified: Secondary | ICD-10-CM | POA: Diagnosis not present

## 2013-04-05 DIAGNOSIS — Z79899 Other long term (current) drug therapy: Secondary | ICD-10-CM | POA: Diagnosis not present

## 2013-04-26 DIAGNOSIS — E0590 Thyrotoxicosis, unspecified without thyrotoxic crisis or storm: Secondary | ICD-10-CM | POA: Diagnosis not present

## 2013-04-26 DIAGNOSIS — J3489 Other specified disorders of nose and nasal sinuses: Secondary | ICD-10-CM | POA: Diagnosis not present

## 2013-04-26 DIAGNOSIS — Z79899 Other long term (current) drug therapy: Secondary | ICD-10-CM | POA: Diagnosis not present

## 2013-04-26 DIAGNOSIS — H9190 Unspecified hearing loss, unspecified ear: Secondary | ICD-10-CM | POA: Diagnosis not present

## 2013-06-20 DIAGNOSIS — E539 Vitamin B deficiency, unspecified: Secondary | ICD-10-CM | POA: Diagnosis not present

## 2013-06-20 DIAGNOSIS — Z6838 Body mass index (BMI) 38.0-38.9, adult: Secondary | ICD-10-CM | POA: Diagnosis not present

## 2013-06-20 DIAGNOSIS — J45909 Unspecified asthma, uncomplicated: Secondary | ICD-10-CM | POA: Diagnosis not present

## 2013-06-20 DIAGNOSIS — Z0000 Encounter for general adult medical examination without abnormal findings: Secondary | ICD-10-CM | POA: Diagnosis not present

## 2013-06-20 DIAGNOSIS — M2550 Pain in unspecified joint: Secondary | ICD-10-CM | POA: Diagnosis not present

## 2013-06-20 DIAGNOSIS — M899 Disorder of bone, unspecified: Secondary | ICD-10-CM | POA: Diagnosis not present

## 2013-06-20 DIAGNOSIS — Z79899 Other long term (current) drug therapy: Secondary | ICD-10-CM | POA: Diagnosis not present

## 2013-06-20 DIAGNOSIS — E039 Hypothyroidism, unspecified: Secondary | ICD-10-CM | POA: Diagnosis not present

## 2013-06-20 DIAGNOSIS — M949 Disorder of cartilage, unspecified: Secondary | ICD-10-CM | POA: Diagnosis not present

## 2013-06-20 DIAGNOSIS — Z136 Encounter for screening for cardiovascular disorders: Secondary | ICD-10-CM | POA: Diagnosis not present

## 2013-06-20 DIAGNOSIS — J309 Allergic rhinitis, unspecified: Secondary | ICD-10-CM | POA: Diagnosis not present

## 2013-07-12 DIAGNOSIS — B351 Tinea unguium: Secondary | ICD-10-CM | POA: Diagnosis not present

## 2013-10-06 DIAGNOSIS — D7589 Other specified diseases of blood and blood-forming organs: Secondary | ICD-10-CM | POA: Diagnosis not present

## 2013-10-13 DIAGNOSIS — L439 Lichen planus, unspecified: Secondary | ICD-10-CM | POA: Diagnosis not present

## 2013-10-13 DIAGNOSIS — Z79899 Other long term (current) drug therapy: Secondary | ICD-10-CM | POA: Diagnosis not present

## 2013-10-13 DIAGNOSIS — B351 Tinea unguium: Secondary | ICD-10-CM | POA: Diagnosis not present

## 2013-11-03 DIAGNOSIS — Z79899 Other long term (current) drug therapy: Secondary | ICD-10-CM | POA: Diagnosis not present

## 2013-11-03 DIAGNOSIS — L988 Other specified disorders of the skin and subcutaneous tissue: Secondary | ICD-10-CM | POA: Diagnosis not present

## 2013-11-03 DIAGNOSIS — L851 Acquired keratosis [keratoderma] palmaris et plantaris: Secondary | ICD-10-CM | POA: Diagnosis not present

## 2013-11-03 DIAGNOSIS — L539 Erythematous condition, unspecified: Secondary | ICD-10-CM | POA: Diagnosis not present

## 2013-11-03 DIAGNOSIS — L439 Lichen planus, unspecified: Secondary | ICD-10-CM | POA: Diagnosis not present

## 2013-11-03 DIAGNOSIS — L821 Other seborrheic keratosis: Secondary | ICD-10-CM | POA: Diagnosis not present

## 2013-11-27 DIAGNOSIS — H0012 Chalazion right lower eyelid: Secondary | ICD-10-CM | POA: Diagnosis not present

## 2013-11-27 DIAGNOSIS — T50905A Adverse effect of unspecified drugs, medicaments and biological substances, initial encounter: Secondary | ICD-10-CM | POA: Diagnosis not present

## 2013-12-19 DIAGNOSIS — Z23 Encounter for immunization: Secondary | ICD-10-CM | POA: Diagnosis not present

## 2014-01-01 DIAGNOSIS — R109 Unspecified abdominal pain: Secondary | ICD-10-CM | POA: Diagnosis not present

## 2014-01-02 ENCOUNTER — Other Ambulatory Visit: Payer: Self-pay | Admitting: Family Medicine

## 2014-01-02 DIAGNOSIS — R1011 Right upper quadrant pain: Secondary | ICD-10-CM

## 2014-01-10 ENCOUNTER — Ambulatory Visit
Admission: RE | Admit: 2014-01-10 | Discharge: 2014-01-10 | Disposition: A | Discharge status: Home / Self Care | Payer: Medicare Other | Source: Ambulatory Visit | Attending: Family Medicine | Admitting: Family Medicine

## 2014-01-10 DIAGNOSIS — R1011 Right upper quadrant pain: Secondary | ICD-10-CM | POA: Diagnosis not present

## 2014-01-17 DIAGNOSIS — M549 Dorsalgia, unspecified: Secondary | ICD-10-CM | POA: Diagnosis not present

## 2014-01-22 DIAGNOSIS — M1990 Unspecified osteoarthritis, unspecified site: Secondary | ICD-10-CM | POA: Diagnosis not present

## 2014-02-13 DIAGNOSIS — Z5181 Encounter for therapeutic drug level monitoring: Secondary | ICD-10-CM | POA: Diagnosis not present

## 2014-02-13 DIAGNOSIS — L439 Lichen planus, unspecified: Secondary | ICD-10-CM | POA: Diagnosis not present

## 2014-02-13 DIAGNOSIS — L438 Other lichen planus: Secondary | ICD-10-CM | POA: Diagnosis not present

## 2014-02-13 DIAGNOSIS — L661 Lichen planopilaris: Secondary | ICD-10-CM | POA: Diagnosis not present

## 2014-02-22 DIAGNOSIS — B356 Tinea cruris: Secondary | ICD-10-CM | POA: Diagnosis not present

## 2014-03-23 ENCOUNTER — Other Ambulatory Visit: Payer: Self-pay | Admitting: Obstetrics

## 2014-03-23 DIAGNOSIS — Z779 Other contact with and (suspected) exposures hazardous to health: Secondary | ICD-10-CM | POA: Diagnosis not present

## 2014-03-23 DIAGNOSIS — N952 Postmenopausal atrophic vaginitis: Secondary | ICD-10-CM | POA: Diagnosis not present

## 2014-03-23 DIAGNOSIS — M8580 Other specified disorders of bone density and structure, unspecified site: Secondary | ICD-10-CM

## 2014-03-23 DIAGNOSIS — Z01419 Encounter for gynecological examination (general) (routine) without abnormal findings: Secondary | ICD-10-CM | POA: Diagnosis not present

## 2014-03-23 DIAGNOSIS — L900 Lichen sclerosus et atrophicus: Secondary | ICD-10-CM | POA: Diagnosis not present

## 2014-03-23 DIAGNOSIS — A6000 Herpesviral infection of urogenital system, unspecified: Secondary | ICD-10-CM | POA: Diagnosis not present

## 2014-03-23 DIAGNOSIS — Z1231 Encounter for screening mammogram for malignant neoplasm of breast: Secondary | ICD-10-CM | POA: Diagnosis not present

## 2014-03-23 DIAGNOSIS — Z780 Asymptomatic menopausal state: Secondary | ICD-10-CM

## 2014-04-04 ENCOUNTER — Ambulatory Visit
Admission: RE | Admit: 2014-04-04 | Discharge: 2014-04-04 | Disposition: A | Discharge status: Home / Self Care | Payer: Medicare Other | Source: Ambulatory Visit | Attending: Obstetrics | Admitting: Obstetrics

## 2014-04-04 DIAGNOSIS — M8580 Other specified disorders of bone density and structure, unspecified site: Secondary | ICD-10-CM

## 2014-04-04 DIAGNOSIS — M899 Disorder of bone, unspecified: Secondary | ICD-10-CM | POA: Diagnosis not present

## 2014-05-15 DIAGNOSIS — L661 Lichen planopilaris: Secondary | ICD-10-CM | POA: Insufficient documentation

## 2014-05-15 DIAGNOSIS — Z79899 Other long term (current) drug therapy: Secondary | ICD-10-CM | POA: Diagnosis not present

## 2014-05-15 DIAGNOSIS — B351 Tinea unguium: Secondary | ICD-10-CM | POA: Diagnosis not present

## 2014-05-15 DIAGNOSIS — L439 Lichen planus, unspecified: Secondary | ICD-10-CM | POA: Diagnosis not present

## 2014-05-15 DIAGNOSIS — L309 Dermatitis, unspecified: Secondary | ICD-10-CM | POA: Diagnosis not present

## 2014-07-02 DIAGNOSIS — H532 Diplopia: Secondary | ICD-10-CM | POA: Diagnosis not present

## 2014-07-02 DIAGNOSIS — H43813 Vitreous degeneration, bilateral: Secondary | ICD-10-CM | POA: Diagnosis not present

## 2014-07-02 DIAGNOSIS — H2513 Age-related nuclear cataract, bilateral: Secondary | ICD-10-CM | POA: Diagnosis not present

## 2014-07-13 DIAGNOSIS — E039 Hypothyroidism, unspecified: Secondary | ICD-10-CM | POA: Diagnosis not present

## 2014-07-13 DIAGNOSIS — K5790 Diverticulosis of intestine, part unspecified, without perforation or abscess without bleeding: Secondary | ICD-10-CM | POA: Diagnosis not present

## 2014-07-13 DIAGNOSIS — Z136 Encounter for screening for cardiovascular disorders: Secondary | ICD-10-CM | POA: Diagnosis not present

## 2014-07-13 DIAGNOSIS — Z0000 Encounter for general adult medical examination without abnormal findings: Secondary | ICD-10-CM | POA: Diagnosis not present

## 2014-07-13 DIAGNOSIS — Z79899 Other long term (current) drug therapy: Secondary | ICD-10-CM | POA: Diagnosis not present

## 2014-07-13 DIAGNOSIS — L570 Actinic keratosis: Secondary | ICD-10-CM | POA: Diagnosis not present

## 2014-07-13 DIAGNOSIS — J4520 Mild intermittent asthma, uncomplicated: Secondary | ICD-10-CM | POA: Diagnosis not present

## 2014-07-13 DIAGNOSIS — M859 Disorder of bone density and structure, unspecified: Secondary | ICD-10-CM | POA: Diagnosis not present

## 2014-07-13 DIAGNOSIS — L900 Lichen sclerosus et atrophicus: Secondary | ICD-10-CM | POA: Diagnosis not present

## 2014-07-13 DIAGNOSIS — Z23 Encounter for immunization: Secondary | ICD-10-CM | POA: Diagnosis not present

## 2014-07-13 DIAGNOSIS — D692 Other nonthrombocytopenic purpura: Secondary | ICD-10-CM | POA: Diagnosis not present

## 2014-07-13 DIAGNOSIS — J309 Allergic rhinitis, unspecified: Secondary | ICD-10-CM | POA: Diagnosis not present

## 2014-08-03 DIAGNOSIS — H25813 Combined forms of age-related cataract, bilateral: Secondary | ICD-10-CM | POA: Diagnosis not present

## 2014-08-03 DIAGNOSIS — H524 Presbyopia: Secondary | ICD-10-CM | POA: Diagnosis not present

## 2014-08-03 DIAGNOSIS — H43813 Vitreous degeneration, bilateral: Secondary | ICD-10-CM | POA: Diagnosis not present

## 2014-08-03 DIAGNOSIS — H3531 Nonexudative age-related macular degeneration: Secondary | ICD-10-CM | POA: Diagnosis not present

## 2014-08-15 ENCOUNTER — Telehealth: Payer: Self-pay | Admitting: Hematology & Oncology

## 2014-08-15 ENCOUNTER — Telehealth: Payer: Self-pay

## 2014-08-15 NOTE — Telephone Encounter (Signed)
Pt call in to confirm and resched appt.

## 2014-08-15 NOTE — Telephone Encounter (Signed)
LM on home and cell phone to return call to confirm appt.

## 2014-08-17 DIAGNOSIS — L089 Local infection of the skin and subcutaneous tissue, unspecified: Secondary | ICD-10-CM | POA: Diagnosis not present

## 2014-08-17 DIAGNOSIS — L661 Lichen planopilaris: Secondary | ICD-10-CM | POA: Diagnosis not present

## 2014-08-17 DIAGNOSIS — Z79899 Other long term (current) drug therapy: Secondary | ICD-10-CM | POA: Diagnosis not present

## 2014-08-17 DIAGNOSIS — Z5181 Encounter for therapeutic drug level monitoring: Secondary | ICD-10-CM | POA: Diagnosis not present

## 2014-08-17 DIAGNOSIS — R234 Changes in skin texture: Secondary | ICD-10-CM | POA: Diagnosis not present

## 2014-08-17 DIAGNOSIS — L659 Nonscarring hair loss, unspecified: Secondary | ICD-10-CM | POA: Diagnosis not present

## 2014-08-17 DIAGNOSIS — L209 Atopic dermatitis, unspecified: Secondary | ICD-10-CM | POA: Diagnosis not present

## 2014-08-17 DIAGNOSIS — L299 Pruritus, unspecified: Secondary | ICD-10-CM | POA: Diagnosis not present

## 2014-08-17 DIAGNOSIS — R209 Unspecified disturbances of skin sensation: Secondary | ICD-10-CM | POA: Diagnosis not present

## 2014-08-17 DIAGNOSIS — L309 Dermatitis, unspecified: Secondary | ICD-10-CM | POA: Diagnosis not present

## 2014-08-17 DIAGNOSIS — R202 Paresthesia of skin: Secondary | ICD-10-CM | POA: Diagnosis not present

## 2014-08-17 DIAGNOSIS — L539 Erythematous condition, unspecified: Secondary | ICD-10-CM | POA: Diagnosis not present

## 2014-09-04 DIAGNOSIS — H25012 Cortical age-related cataract, left eye: Secondary | ICD-10-CM | POA: Diagnosis not present

## 2014-09-04 DIAGNOSIS — H25812 Combined forms of age-related cataract, left eye: Secondary | ICD-10-CM | POA: Diagnosis not present

## 2014-09-04 DIAGNOSIS — H2512 Age-related nuclear cataract, left eye: Secondary | ICD-10-CM | POA: Diagnosis not present

## 2014-09-04 DIAGNOSIS — H21562 Pupillary abnormality, left eye: Secondary | ICD-10-CM | POA: Diagnosis not present

## 2014-09-04 DIAGNOSIS — H25032 Anterior subcapsular polar age-related cataract, left eye: Secondary | ICD-10-CM | POA: Diagnosis not present

## 2014-09-04 DIAGNOSIS — H25042 Posterior subcapsular polar age-related cataract, left eye: Secondary | ICD-10-CM | POA: Diagnosis not present

## 2014-09-17 DIAGNOSIS — T148 Other injury of unspecified body region: Secondary | ICD-10-CM | POA: Diagnosis not present

## 2014-09-17 DIAGNOSIS — S81852A Open bite, left lower leg, initial encounter: Secondary | ICD-10-CM | POA: Diagnosis not present

## 2014-10-02 ENCOUNTER — Ambulatory Visit (HOSPITAL_BASED_OUTPATIENT_CLINIC_OR_DEPARTMENT_OTHER): Payer: Medicare Other | Admitting: Hematology & Oncology

## 2014-10-02 ENCOUNTER — Ambulatory Visit: Payer: Medicare Other

## 2014-10-02 ENCOUNTER — Encounter: Payer: Self-pay | Admitting: Hematology & Oncology

## 2014-10-02 ENCOUNTER — Other Ambulatory Visit (HOSPITAL_BASED_OUTPATIENT_CLINIC_OR_DEPARTMENT_OTHER): Payer: Medicare Other

## 2014-10-02 VITALS — BP 130/52 | HR 67 | Temp 97.3°F | Resp 16 | Ht 61.0 in | Wt 183.0 lb

## 2014-10-02 DIAGNOSIS — D7589 Other specified diseases of blood and blood-forming organs: Secondary | ICD-10-CM

## 2014-10-02 LAB — CBC WITH DIFFERENTIAL (CANCER CENTER ONLY)
BASO#: 0.1 10*3/uL (ref 0.0–0.2)
BASO%: 0.7 % (ref 0.0–2.0)
EOS ABS: 0.2 10*3/uL (ref 0.0–0.5)
EOS%: 2.4 % (ref 0.0–7.0)
HEMATOCRIT: 37.8 % (ref 34.8–46.6)
HGB: 13.3 g/dL (ref 11.6–15.9)
LYMPH#: 1.2 10*3/uL (ref 0.9–3.3)
LYMPH%: 18.2 % (ref 14.0–48.0)
MCH: 35.7 pg — AB (ref 26.0–34.0)
MCHC: 35.2 g/dL (ref 32.0–36.0)
MCV: 101 fL (ref 81–101)
MONO#: 0.6 10*3/uL (ref 0.1–0.9)
MONO%: 9.1 % (ref 0.0–13.0)
NEUT#: 4.7 10*3/uL (ref 1.5–6.5)
NEUT%: 69.6 % (ref 39.6–80.0)
PLATELETS: 249 10*3/uL (ref 145–400)
RBC: 3.73 10*6/uL (ref 3.70–5.32)
RDW: 12.6 % (ref 11.1–15.7)
WBC: 6.7 10*3/uL (ref 3.9–10.0)

## 2014-10-02 LAB — CHCC SATELLITE - SMEAR

## 2014-10-02 NOTE — Progress Notes (Signed)
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 OTHER  :  My red blood cells are large.  HPI:  [name] is a very charming 69 year old white female. She has a history of lichen planus. She is on methotrexate weekly.  Otherwise, she is pretty healthy. She is not had any other surgeries.  She is up-to-date with her mammogram , colonoscopy, and female exams.   She is followed by Dr. Stephanie Craig and she is found that there has been some macrocytic changes with her red cells area and   Blood work that was done back in late May showed a white cell count 6.5. Hemoglobin 13.2. Platelet count 249. MCV was 104.   Ms.  Melissa Craig says that her red cells have enlarged for a while. She has taken methotrexate for about a year. Methotrexate is helping her skin rash.   She was told that she could drink only half a glass wine a day because of the large red cells.   She's had no bleeding. She's had no numbness or tingling in the hands or feet. She's had no cough. She's had no weight loss or weight gain. She has lost about 30 pounds in the past 8 months. She is on a diet.  She  does try  To stay active.    she will be going to [place] in the fall for vacation.   She is not noted any headaches. Her hair has thinned a little bit because of methotrexate.   The methotrexate has really helped her skin lesions.   Overall, her performance status is ECOG 0.  No past medical history on file.:  No past surgical history on file.:   Current outpatient prescriptions:  .  ASMANEX 60 METERED DOSES 220 MCG/INH inhaler, , Disp: , Rfl:  .  Biotin 5000 MCG CAPS, Take by mouth., Disp: , Rfl:  .  calcium citrate-vitamin D (CITRACAL+D) 315-200 MG-UNIT per tablet, Take 1 tablet by mouth 2 (two) times daily., Disp: , Rfl:  .  cetirizine (ZYRTEC) 10 MG tablet, Take 10 mg by mouth daily., Disp: , Rfl:  .  cholecalciferol (VITAMIN D) 1000 UNITS tablet,  Take 1,000 Units by mouth daily., Disp: , Rfl:  .  folic acid (FOLVITE) 1 MG tablet, , Disp: , Rfl:  .  methotrexate (RHEUMATREX) 2.5 MG tablet, , Disp: , Rfl:  .  prednisoLONE acetate (PRED FORTE) 1 % ophthalmic suspension, , Disp: , Rfl:  .  PROLENSA 0.07 % SOLN, , Disp: , Rfl:  .  SYNTHROID 112 MCG tablet, , Disp: , Rfl:  .  valACYclovir (VALTREX) 500 MG tablet, , Disp: , Rfl: :  :  Allergies  Allergen Reactions  . Gentamycin [Gentamicin] Swelling  . Ceftin [Cefuroxime Axetil] Other (See Comments)    Patient cant remember reaction to medication  . Ciprocinonide [Fluocinolone] Other (See Comments)    Patient cant remember reaction to drug  . Ampicillin Rash  . Other Itching    [employer] products, Cats, mold, Mussels. With Cast itchy runy eyes headaches, sneezing.  Marland Kitchen Penicillins Rash  . Pollen Extract Itching  . Sulfa Antibiotics Rash  :  No family history on file.:  Social History   Social History  . Marital Status: Divorced    Spouse Name: N/A  . Number of Children: N/A  . Years of Education: N/A   Occupational History  . Not on file.   Social History Main  Topics  . Smoking status: Former Research scientist (life sciences)  . Smokeless tobacco: Not on file     Comment: Stopped in 1986  . Alcohol Use: Not on file  . Drug Use: Not on file  . Sexual Activity: Not on file   Other Topics Concern  . Not on file   Social History Narrative  . No narrative on file  :  Pertinent items are noted in HPI.  Exam: @IPVITALS @  well-developed and well-nourished white female in no obvious distress. Vital signs showed temperature of 97.3. Pulse 67. Blood pressure 130/52. Weight is 183 pounds. Head and neck exam shows no ocular or oral lesions. She has no palpable cervical or supraclavicular lymph nodes. Lungs are clear. Cardiac exam regular rate and rhythm with no murmurs, rubs or bruits. Abdomen is soft. She has good bowel sounds. There is no fluid wave. There is no palpable liver or spleen tip. Back exam  shows no tenderness over the spine, ribs or hips. Skin exam shows no rashes, ecchymoses or petechia. Neurological exam is nonfocal.   Recent Labs  10/02/14 1029  WBC 6.7  HGB 13.3  HCT 37.8  PLT 249   No results for input(s): NA, K, CL, CO2, GLUCOSE, BUN, CREATININE, CALCIUM in the last 72 hours.  Blood smear review:  Normochromic and normocytic population of red blood cells. There may be some slight macrocytosis. There is no nuclear red cells. There's no teardrop cells. She has no immature myeloid or lymphoid forms. There are no hyper segmented polys. Platelets are adequate in number and size. She has no target cells. There is no rouleau formation.  Pathology: none    Assessment and Plan:  [name] is a very nice 69 year old white female. She has mild macrocytosis. She is not anemic. Her blood smear is relatively benign.  I have to believe that the methotrexate is what is causing the macrocytosis. She is on this weekly. Is not yet N issues with leukopenia, anemia or thrombocytopenia.   I told her that she can have her glass of one a day. I think this is by healthy for her.   I don't see that she has any bone marrow disorder. I don't see any issue with  Myelodysplasia.    I don't think we have to get her back to the office. I don't find anything on exam. Again, her blood smear is benign.     again, I think the macrocytic changes from the methotrexate that she is taking. She needs this for her skin lesions. As long as she is not pancytopenic with this, we are okay.     I spent about 45 minutes with her. I showed her the labs. Answered all her questions. She was very happy that she could have her glass of one a day.

## 2014-10-24 DIAGNOSIS — Z9889 Other specified postprocedural states: Secondary | ICD-10-CM | POA: Diagnosis not present

## 2014-10-24 DIAGNOSIS — J45909 Unspecified asthma, uncomplicated: Secondary | ICD-10-CM | POA: Diagnosis not present

## 2014-10-24 DIAGNOSIS — Z79899 Other long term (current) drug therapy: Secondary | ICD-10-CM | POA: Diagnosis not present

## 2014-10-24 DIAGNOSIS — Z881 Allergy status to other antibiotic agents status: Secondary | ICD-10-CM | POA: Diagnosis not present

## 2014-10-24 DIAGNOSIS — Z880 Allergy status to penicillin: Secondary | ICD-10-CM | POA: Diagnosis not present

## 2014-10-24 DIAGNOSIS — Z888 Allergy status to other drugs, medicaments and biological substances status: Secondary | ICD-10-CM | POA: Diagnosis not present

## 2014-10-24 DIAGNOSIS — J0140 Acute pansinusitis, unspecified: Secondary | ICD-10-CM | POA: Diagnosis not present

## 2014-10-24 DIAGNOSIS — Z882 Allergy status to sulfonamides status: Secondary | ICD-10-CM | POA: Diagnosis not present

## 2014-11-08 DIAGNOSIS — H269 Unspecified cataract: Secondary | ICD-10-CM | POA: Diagnosis not present

## 2014-11-08 DIAGNOSIS — E0500 Thyrotoxicosis with diffuse goiter without thyrotoxic crisis or storm: Secondary | ICD-10-CM | POA: Diagnosis not present

## 2014-11-08 DIAGNOSIS — H5713 Ocular pain, bilateral: Secondary | ICD-10-CM | POA: Diagnosis not present

## 2014-11-08 DIAGNOSIS — Z961 Presence of intraocular lens: Secondary | ICD-10-CM | POA: Diagnosis not present

## 2014-11-09 ENCOUNTER — Other Ambulatory Visit: Payer: Federal, State, Local not specified - PPO

## 2014-11-09 ENCOUNTER — Ambulatory Visit: Payer: Federal, State, Local not specified - PPO

## 2014-11-09 ENCOUNTER — Ambulatory Visit: Payer: Federal, State, Local not specified - PPO | Admitting: Hematology & Oncology

## 2014-12-07 DIAGNOSIS — Z79899 Other long term (current) drug therapy: Secondary | ICD-10-CM | POA: Diagnosis not present

## 2014-12-07 DIAGNOSIS — L661 Lichen planopilaris: Secondary | ICD-10-CM | POA: Diagnosis not present

## 2014-12-07 DIAGNOSIS — L439 Lichen planus, unspecified: Secondary | ICD-10-CM | POA: Diagnosis not present

## 2014-12-07 DIAGNOSIS — Z5181 Encounter for therapeutic drug level monitoring: Secondary | ICD-10-CM | POA: Diagnosis not present

## 2014-12-13 DIAGNOSIS — E0500 Thyrotoxicosis with diffuse goiter without thyrotoxic crisis or storm: Secondary | ICD-10-CM | POA: Diagnosis not present

## 2014-12-13 DIAGNOSIS — E039 Hypothyroidism, unspecified: Secondary | ICD-10-CM | POA: Diagnosis not present

## 2014-12-13 DIAGNOSIS — H5713 Ocular pain, bilateral: Secondary | ICD-10-CM | POA: Diagnosis not present

## 2014-12-13 DIAGNOSIS — Z882 Allergy status to sulfonamides status: Secondary | ICD-10-CM | POA: Diagnosis not present

## 2014-12-13 DIAGNOSIS — J320 Chronic maxillary sinusitis: Secondary | ICD-10-CM | POA: Diagnosis not present

## 2014-12-13 DIAGNOSIS — J45909 Unspecified asthma, uncomplicated: Secondary | ICD-10-CM | POA: Diagnosis not present

## 2014-12-13 DIAGNOSIS — Z79899 Other long term (current) drug therapy: Secondary | ICD-10-CM | POA: Diagnosis not present

## 2014-12-13 DIAGNOSIS — Z880 Allergy status to penicillin: Secondary | ICD-10-CM | POA: Diagnosis not present

## 2015-03-20 DIAGNOSIS — L900 Lichen sclerosus et atrophicus: Secondary | ICD-10-CM | POA: Diagnosis not present

## 2015-03-20 DIAGNOSIS — J309 Allergic rhinitis, unspecified: Secondary | ICD-10-CM | POA: Diagnosis not present

## 2015-03-20 DIAGNOSIS — Z6836 Body mass index (BMI) 36.0-36.9, adult: Secondary | ICD-10-CM | POA: Diagnosis not present

## 2015-03-20 DIAGNOSIS — Z79899 Other long term (current) drug therapy: Secondary | ICD-10-CM | POA: Diagnosis not present

## 2015-03-20 DIAGNOSIS — I679 Cerebrovascular disease, unspecified: Secondary | ICD-10-CM | POA: Diagnosis not present

## 2015-03-20 DIAGNOSIS — D7589 Other specified diseases of blood and blood-forming organs: Secondary | ICD-10-CM | POA: Diagnosis not present

## 2015-03-20 DIAGNOSIS — J4520 Mild intermittent asthma, uncomplicated: Secondary | ICD-10-CM | POA: Diagnosis not present

## 2015-03-20 DIAGNOSIS — M25569 Pain in unspecified knee: Secondary | ICD-10-CM | POA: Diagnosis not present

## 2015-03-20 DIAGNOSIS — E039 Hypothyroidism, unspecified: Secondary | ICD-10-CM | POA: Diagnosis not present

## 2015-03-20 DIAGNOSIS — E669 Obesity, unspecified: Secondary | ICD-10-CM | POA: Diagnosis not present

## 2015-03-21 DIAGNOSIS — Z79899 Other long term (current) drug therapy: Secondary | ICD-10-CM | POA: Diagnosis not present

## 2015-03-21 DIAGNOSIS — L661 Lichen planopilaris: Secondary | ICD-10-CM | POA: Diagnosis not present

## 2015-04-11 DIAGNOSIS — D125 Benign neoplasm of sigmoid colon: Secondary | ICD-10-CM | POA: Diagnosis not present

## 2015-04-11 DIAGNOSIS — Z86010 Personal history of colonic polyps: Secondary | ICD-10-CM | POA: Diagnosis not present

## 2015-04-11 DIAGNOSIS — K5730 Diverticulosis of large intestine without perforation or abscess without bleeding: Secondary | ICD-10-CM | POA: Diagnosis not present

## 2015-04-11 DIAGNOSIS — D123 Benign neoplasm of transverse colon: Secondary | ICD-10-CM | POA: Diagnosis not present

## 2015-04-11 DIAGNOSIS — D126 Benign neoplasm of colon, unspecified: Secondary | ICD-10-CM | POA: Diagnosis not present

## 2015-05-13 DIAGNOSIS — H25811 Combined forms of age-related cataract, right eye: Secondary | ICD-10-CM | POA: Diagnosis not present

## 2015-05-13 DIAGNOSIS — Z961 Presence of intraocular lens: Secondary | ICD-10-CM | POA: Diagnosis not present

## 2015-05-13 DIAGNOSIS — H43813 Vitreous degeneration, bilateral: Secondary | ICD-10-CM | POA: Diagnosis not present

## 2015-06-21 DIAGNOSIS — M25562 Pain in left knee: Secondary | ICD-10-CM | POA: Diagnosis not present

## 2015-06-21 DIAGNOSIS — M25561 Pain in right knee: Secondary | ICD-10-CM | POA: Diagnosis not present

## 2015-06-27 DIAGNOSIS — L089 Local infection of the skin and subcutaneous tissue, unspecified: Secondary | ICD-10-CM | POA: Diagnosis not present

## 2015-06-27 DIAGNOSIS — L661 Lichen planopilaris: Secondary | ICD-10-CM | POA: Diagnosis not present

## 2015-06-27 DIAGNOSIS — L2089 Other atopic dermatitis: Secondary | ICD-10-CM | POA: Diagnosis not present

## 2015-06-27 DIAGNOSIS — L438 Other lichen planus: Secondary | ICD-10-CM | POA: Diagnosis not present

## 2015-06-27 DIAGNOSIS — R040 Epistaxis: Secondary | ICD-10-CM | POA: Diagnosis not present

## 2015-06-27 DIAGNOSIS — L658 Other specified nonscarring hair loss: Secondary | ICD-10-CM | POA: Diagnosis not present

## 2015-06-27 DIAGNOSIS — L601 Onycholysis: Secondary | ICD-10-CM | POA: Diagnosis not present

## 2015-07-02 DIAGNOSIS — M859 Disorder of bone density and structure, unspecified: Secondary | ICD-10-CM | POA: Diagnosis not present

## 2015-07-02 DIAGNOSIS — L900 Lichen sclerosus et atrophicus: Secondary | ICD-10-CM | POA: Diagnosis not present

## 2015-07-02 DIAGNOSIS — N9411 Superficial (introital) dyspareunia: Secondary | ICD-10-CM | POA: Diagnosis not present

## 2015-07-02 DIAGNOSIS — Z779 Other contact with and (suspected) exposures hazardous to health: Secondary | ICD-10-CM | POA: Diagnosis not present

## 2015-07-02 DIAGNOSIS — Z1231 Encounter for screening mammogram for malignant neoplasm of breast: Secondary | ICD-10-CM | POA: Diagnosis not present

## 2015-07-02 DIAGNOSIS — A6000 Herpesviral infection of urogenital system, unspecified: Secondary | ICD-10-CM | POA: Diagnosis not present

## 2015-07-23 DIAGNOSIS — E669 Obesity, unspecified: Secondary | ICD-10-CM | POA: Diagnosis not present

## 2015-07-23 DIAGNOSIS — Z79899 Other long term (current) drug therapy: Secondary | ICD-10-CM | POA: Diagnosis not present

## 2015-07-23 DIAGNOSIS — J4520 Mild intermittent asthma, uncomplicated: Secondary | ICD-10-CM | POA: Diagnosis not present

## 2015-07-23 DIAGNOSIS — Z1389 Encounter for screening for other disorder: Secondary | ICD-10-CM | POA: Diagnosis not present

## 2015-07-23 DIAGNOSIS — L570 Actinic keratosis: Secondary | ICD-10-CM | POA: Diagnosis not present

## 2015-07-23 DIAGNOSIS — D7589 Other specified diseases of blood and blood-forming organs: Secondary | ICD-10-CM | POA: Diagnosis not present

## 2015-07-23 DIAGNOSIS — L900 Lichen sclerosus et atrophicus: Secondary | ICD-10-CM | POA: Diagnosis not present

## 2015-07-23 DIAGNOSIS — E539 Vitamin B deficiency, unspecified: Secondary | ICD-10-CM | POA: Diagnosis not present

## 2015-07-23 DIAGNOSIS — Z0000 Encounter for general adult medical examination without abnormal findings: Secondary | ICD-10-CM | POA: Diagnosis not present

## 2015-07-23 DIAGNOSIS — I679 Cerebrovascular disease, unspecified: Secondary | ICD-10-CM | POA: Diagnosis not present

## 2015-07-23 DIAGNOSIS — R609 Edema, unspecified: Secondary | ICD-10-CM | POA: Diagnosis not present

## 2015-07-23 DIAGNOSIS — Z6838 Body mass index (BMI) 38.0-38.9, adult: Secondary | ICD-10-CM | POA: Diagnosis not present

## 2015-07-23 DIAGNOSIS — Z7189 Other specified counseling: Secondary | ICD-10-CM | POA: Diagnosis not present

## 2015-07-23 DIAGNOSIS — E039 Hypothyroidism, unspecified: Secondary | ICD-10-CM | POA: Diagnosis not present

## 2015-07-23 DIAGNOSIS — J309 Allergic rhinitis, unspecified: Secondary | ICD-10-CM | POA: Diagnosis not present

## 2015-07-30 DIAGNOSIS — H25031 Anterior subcapsular polar age-related cataract, right eye: Secondary | ICD-10-CM | POA: Diagnosis not present

## 2015-07-30 DIAGNOSIS — H21561 Pupillary abnormality, right eye: Secondary | ICD-10-CM | POA: Diagnosis not present

## 2015-07-30 DIAGNOSIS — H25041 Posterior subcapsular polar age-related cataract, right eye: Secondary | ICD-10-CM | POA: Diagnosis not present

## 2015-07-30 DIAGNOSIS — H25011 Cortical age-related cataract, right eye: Secondary | ICD-10-CM | POA: Diagnosis not present

## 2015-07-30 DIAGNOSIS — H25811 Combined forms of age-related cataract, right eye: Secondary | ICD-10-CM | POA: Diagnosis not present

## 2015-07-30 DIAGNOSIS — H2511 Age-related nuclear cataract, right eye: Secondary | ICD-10-CM | POA: Diagnosis not present

## 2015-09-27 DIAGNOSIS — L658 Other specified nonscarring hair loss: Secondary | ICD-10-CM | POA: Diagnosis not present

## 2015-09-27 DIAGNOSIS — L661 Lichen planopilaris: Secondary | ICD-10-CM | POA: Diagnosis not present

## 2015-09-27 DIAGNOSIS — L438 Other lichen planus: Secondary | ICD-10-CM | POA: Diagnosis not present

## 2015-12-20 DIAGNOSIS — M791 Myalgia: Secondary | ICD-10-CM | POA: Diagnosis not present

## 2015-12-20 DIAGNOSIS — D171 Benign lipomatous neoplasm of skin and subcutaneous tissue of trunk: Secondary | ICD-10-CM | POA: Diagnosis not present

## 2015-12-20 DIAGNOSIS — Z5181 Encounter for therapeutic drug level monitoring: Secondary | ICD-10-CM | POA: Diagnosis not present

## 2015-12-31 DIAGNOSIS — L2089 Other atopic dermatitis: Secondary | ICD-10-CM | POA: Diagnosis not present

## 2015-12-31 DIAGNOSIS — Z79899 Other long term (current) drug therapy: Secondary | ICD-10-CM | POA: Diagnosis not present

## 2015-12-31 DIAGNOSIS — L661 Lichen planopilaris: Secondary | ICD-10-CM | POA: Diagnosis not present

## 2016-01-15 DIAGNOSIS — Z23 Encounter for immunization: Secondary | ICD-10-CM | POA: Diagnosis not present

## 2016-01-27 DIAGNOSIS — J4520 Mild intermittent asthma, uncomplicated: Secondary | ICD-10-CM | POA: Diagnosis not present

## 2016-01-27 DIAGNOSIS — E669 Obesity, unspecified: Secondary | ICD-10-CM | POA: Diagnosis not present

## 2016-01-27 DIAGNOSIS — E039 Hypothyroidism, unspecified: Secondary | ICD-10-CM | POA: Diagnosis not present

## 2016-01-27 DIAGNOSIS — D171 Benign lipomatous neoplasm of skin and subcutaneous tissue of trunk: Secondary | ICD-10-CM | POA: Diagnosis not present

## 2016-01-27 DIAGNOSIS — L900 Lichen sclerosus et atrophicus: Secondary | ICD-10-CM | POA: Diagnosis not present

## 2016-01-27 DIAGNOSIS — M791 Myalgia: Secondary | ICD-10-CM | POA: Diagnosis not present

## 2016-01-27 DIAGNOSIS — Z6837 Body mass index (BMI) 37.0-37.9, adult: Secondary | ICD-10-CM | POA: Diagnosis not present

## 2016-01-27 DIAGNOSIS — Z5181 Encounter for therapeutic drug level monitoring: Secondary | ICD-10-CM | POA: Diagnosis not present

## 2016-01-27 DIAGNOSIS — M25562 Pain in left knee: Secondary | ICD-10-CM | POA: Diagnosis not present

## 2016-01-27 DIAGNOSIS — M25551 Pain in right hip: Secondary | ICD-10-CM | POA: Diagnosis not present

## 2016-02-04 DIAGNOSIS — R202 Paresthesia of skin: Secondary | ICD-10-CM | POA: Diagnosis not present

## 2016-02-04 DIAGNOSIS — Z743 Need for continuous supervision: Secondary | ICD-10-CM | POA: Diagnosis not present

## 2016-02-04 DIAGNOSIS — I6789 Other cerebrovascular disease: Secondary | ICD-10-CM | POA: Diagnosis not present

## 2016-03-09 DIAGNOSIS — M791 Myalgia: Secondary | ICD-10-CM | POA: Diagnosis not present

## 2016-03-13 DIAGNOSIS — M791 Myalgia: Secondary | ICD-10-CM | POA: Diagnosis not present

## 2016-03-17 DIAGNOSIS — M791 Myalgia: Secondary | ICD-10-CM | POA: Diagnosis not present

## 2016-03-20 DIAGNOSIS — M791 Myalgia: Secondary | ICD-10-CM | POA: Diagnosis not present

## 2016-03-25 DIAGNOSIS — M791 Myalgia: Secondary | ICD-10-CM | POA: Diagnosis not present

## 2016-03-30 DIAGNOSIS — M791 Myalgia: Secondary | ICD-10-CM | POA: Diagnosis not present

## 2016-04-02 DIAGNOSIS — M791 Myalgia: Secondary | ICD-10-CM | POA: Diagnosis not present

## 2016-04-08 DIAGNOSIS — M791 Myalgia: Secondary | ICD-10-CM | POA: Diagnosis not present

## 2016-04-13 DIAGNOSIS — M791 Myalgia: Secondary | ICD-10-CM | POA: Diagnosis not present

## 2016-04-16 DIAGNOSIS — M791 Myalgia: Secondary | ICD-10-CM | POA: Diagnosis not present

## 2016-04-20 DIAGNOSIS — M791 Myalgia: Secondary | ICD-10-CM | POA: Diagnosis not present

## 2016-04-23 DIAGNOSIS — L658 Other specified nonscarring hair loss: Secondary | ICD-10-CM | POA: Diagnosis not present

## 2016-04-23 DIAGNOSIS — L661 Lichen planopilaris: Secondary | ICD-10-CM | POA: Diagnosis not present

## 2016-04-23 DIAGNOSIS — L608 Other nail disorders: Secondary | ICD-10-CM | POA: Diagnosis not present

## 2016-04-24 DIAGNOSIS — M791 Myalgia: Secondary | ICD-10-CM | POA: Diagnosis not present

## 2016-07-28 DIAGNOSIS — Z1389 Encounter for screening for other disorder: Secondary | ICD-10-CM | POA: Diagnosis not present

## 2016-07-28 DIAGNOSIS — J4520 Mild intermittent asthma, uncomplicated: Secondary | ICD-10-CM | POA: Diagnosis not present

## 2016-07-28 DIAGNOSIS — E039 Hypothyroidism, unspecified: Secondary | ICD-10-CM | POA: Diagnosis not present

## 2016-07-28 DIAGNOSIS — Z79899 Other long term (current) drug therapy: Secondary | ICD-10-CM | POA: Diagnosis not present

## 2016-07-28 DIAGNOSIS — Z5181 Encounter for therapeutic drug level monitoring: Secondary | ICD-10-CM | POA: Diagnosis not present

## 2016-07-28 DIAGNOSIS — E669 Obesity, unspecified: Secondary | ICD-10-CM | POA: Diagnosis not present

## 2016-07-28 DIAGNOSIS — Z6833 Body mass index (BMI) 33.0-33.9, adult: Secondary | ICD-10-CM | POA: Diagnosis not present

## 2016-07-28 DIAGNOSIS — L900 Lichen sclerosus et atrophicus: Secondary | ICD-10-CM | POA: Diagnosis not present

## 2016-07-28 DIAGNOSIS — J309 Allergic rhinitis, unspecified: Secondary | ICD-10-CM | POA: Diagnosis not present

## 2016-07-28 DIAGNOSIS — D171 Benign lipomatous neoplasm of skin and subcutaneous tissue of trunk: Secondary | ICD-10-CM | POA: Diagnosis not present

## 2016-07-28 DIAGNOSIS — I679 Cerebrovascular disease, unspecified: Secondary | ICD-10-CM | POA: Diagnosis not present

## 2016-07-28 DIAGNOSIS — Z0000 Encounter for general adult medical examination without abnormal findings: Secondary | ICD-10-CM | POA: Diagnosis not present

## 2016-07-29 ENCOUNTER — Other Ambulatory Visit: Payer: Self-pay | Admitting: Internal Medicine

## 2016-07-29 DIAGNOSIS — R319 Hematuria, unspecified: Secondary | ICD-10-CM

## 2016-07-30 ENCOUNTER — Other Ambulatory Visit: Payer: Federal, State, Local not specified - PPO

## 2016-07-30 DIAGNOSIS — E538 Deficiency of other specified B group vitamins: Secondary | ICD-10-CM | POA: Diagnosis not present

## 2016-08-03 ENCOUNTER — Ambulatory Visit
Admission: RE | Admit: 2016-08-03 | Discharge: 2016-08-03 | Disposition: A | Discharge status: Home / Self Care | Payer: Medicare Other | Source: Ambulatory Visit | Attending: Internal Medicine | Admitting: Internal Medicine

## 2016-08-03 DIAGNOSIS — N200 Calculus of kidney: Secondary | ICD-10-CM | POA: Diagnosis not present

## 2016-08-03 DIAGNOSIS — R319 Hematuria, unspecified: Secondary | ICD-10-CM

## 2016-08-06 DIAGNOSIS — E538 Deficiency of other specified B group vitamins: Secondary | ICD-10-CM | POA: Diagnosis not present

## 2016-08-10 DIAGNOSIS — R3129 Other microscopic hematuria: Secondary | ICD-10-CM | POA: Diagnosis not present

## 2016-08-10 DIAGNOSIS — E538 Deficiency of other specified B group vitamins: Secondary | ICD-10-CM | POA: Diagnosis not present

## 2016-08-13 DIAGNOSIS — E538 Deficiency of other specified B group vitamins: Secondary | ICD-10-CM | POA: Diagnosis not present

## 2016-08-20 DIAGNOSIS — E539 Vitamin B deficiency, unspecified: Secondary | ICD-10-CM | POA: Diagnosis not present

## 2016-08-25 DIAGNOSIS — Z79899 Other long term (current) drug therapy: Secondary | ICD-10-CM | POA: Diagnosis not present

## 2016-08-25 DIAGNOSIS — L720 Epidermal cyst: Secondary | ICD-10-CM | POA: Diagnosis not present

## 2016-08-25 DIAGNOSIS — L820 Inflamed seborrheic keratosis: Secondary | ICD-10-CM | POA: Diagnosis not present

## 2016-08-25 DIAGNOSIS — L661 Lichen planopilaris: Secondary | ICD-10-CM | POA: Diagnosis not present

## 2016-09-02 DIAGNOSIS — N952 Postmenopausal atrophic vaginitis: Secondary | ICD-10-CM | POA: Diagnosis not present

## 2016-09-02 DIAGNOSIS — Z1231 Encounter for screening mammogram for malignant neoplasm of breast: Secondary | ICD-10-CM | POA: Diagnosis not present

## 2016-09-02 DIAGNOSIS — Z779 Other contact with and (suspected) exposures hazardous to health: Secondary | ICD-10-CM | POA: Diagnosis not present

## 2016-09-02 DIAGNOSIS — A6000 Herpesviral infection of urogenital system, unspecified: Secondary | ICD-10-CM | POA: Diagnosis not present

## 2016-09-02 DIAGNOSIS — R319 Hematuria, unspecified: Secondary | ICD-10-CM | POA: Diagnosis not present

## 2016-09-02 DIAGNOSIS — L900 Lichen sclerosus et atrophicus: Secondary | ICD-10-CM | POA: Diagnosis not present

## 2016-09-08 ENCOUNTER — Other Ambulatory Visit: Payer: Self-pay | Admitting: Obstetrics

## 2016-09-08 DIAGNOSIS — M8580 Other specified disorders of bone density and structure, unspecified site: Secondary | ICD-10-CM

## 2016-09-16 ENCOUNTER — Ambulatory Visit
Admission: RE | Admit: 2016-09-16 | Discharge: 2016-09-16 | Disposition: A | Discharge status: Home / Self Care | Payer: Medicare Other | Source: Ambulatory Visit | Attending: Obstetrics | Admitting: Obstetrics

## 2016-09-16 DIAGNOSIS — M8580 Other specified disorders of bone density and structure, unspecified site: Secondary | ICD-10-CM

## 2016-09-16 DIAGNOSIS — M8589 Other specified disorders of bone density and structure, multiple sites: Secondary | ICD-10-CM | POA: Diagnosis not present

## 2016-09-16 DIAGNOSIS — Z780 Asymptomatic menopausal state: Secondary | ICD-10-CM | POA: Diagnosis not present

## 2016-09-17 DIAGNOSIS — Z961 Presence of intraocular lens: Secondary | ICD-10-CM | POA: Diagnosis not present

## 2016-09-17 DIAGNOSIS — H524 Presbyopia: Secondary | ICD-10-CM | POA: Diagnosis not present

## 2016-09-17 DIAGNOSIS — H353121 Nonexudative age-related macular degeneration, left eye, early dry stage: Secondary | ICD-10-CM | POA: Diagnosis not present

## 2016-09-17 DIAGNOSIS — H04123 Dry eye syndrome of bilateral lacrimal glands: Secondary | ICD-10-CM | POA: Diagnosis not present

## 2016-09-21 DIAGNOSIS — E538 Deficiency of other specified B group vitamins: Secondary | ICD-10-CM | POA: Diagnosis not present

## 2016-10-21 DIAGNOSIS — R311 Benign essential microscopic hematuria: Secondary | ICD-10-CM | POA: Diagnosis not present

## 2016-10-21 DIAGNOSIS — N281 Cyst of kidney, acquired: Secondary | ICD-10-CM | POA: Diagnosis not present

## 2016-10-22 DIAGNOSIS — M859 Disorder of bone density and structure, unspecified: Secondary | ICD-10-CM | POA: Diagnosis not present

## 2016-10-23 DIAGNOSIS — E538 Deficiency of other specified B group vitamins: Secondary | ICD-10-CM | POA: Diagnosis not present

## 2016-10-28 DIAGNOSIS — M859 Disorder of bone density and structure, unspecified: Secondary | ICD-10-CM | POA: Diagnosis not present

## 2016-10-29 DIAGNOSIS — Z23 Encounter for immunization: Secondary | ICD-10-CM | POA: Diagnosis not present

## 2016-11-26 DIAGNOSIS — E538 Deficiency of other specified B group vitamins: Secondary | ICD-10-CM | POA: Diagnosis not present

## 2016-12-17 DIAGNOSIS — L658 Other specified nonscarring hair loss: Secondary | ICD-10-CM | POA: Diagnosis not present

## 2016-12-17 DIAGNOSIS — Z79899 Other long term (current) drug therapy: Secondary | ICD-10-CM | POA: Diagnosis not present

## 2016-12-17 DIAGNOSIS — Z5181 Encounter for therapeutic drug level monitoring: Secondary | ICD-10-CM | POA: Diagnosis not present

## 2016-12-17 DIAGNOSIS — L661 Lichen planopilaris: Secondary | ICD-10-CM | POA: Diagnosis not present

## 2016-12-17 DIAGNOSIS — L280 Lichen simplex chronicus: Secondary | ICD-10-CM | POA: Diagnosis not present

## 2016-12-25 DIAGNOSIS — E538 Deficiency of other specified B group vitamins: Secondary | ICD-10-CM | POA: Diagnosis not present

## 2017-01-27 DIAGNOSIS — E538 Deficiency of other specified B group vitamins: Secondary | ICD-10-CM | POA: Diagnosis not present

## 2017-02-26 DIAGNOSIS — E538 Deficiency of other specified B group vitamins: Secondary | ICD-10-CM | POA: Diagnosis not present

## 2017-03-15 DIAGNOSIS — E039 Hypothyroidism, unspecified: Secondary | ICD-10-CM | POA: Diagnosis not present

## 2017-03-15 DIAGNOSIS — J4520 Mild intermittent asthma, uncomplicated: Secondary | ICD-10-CM | POA: Diagnosis not present

## 2017-03-15 DIAGNOSIS — E538 Deficiency of other specified B group vitamins: Secondary | ICD-10-CM | POA: Diagnosis not present

## 2017-03-15 DIAGNOSIS — R3129 Other microscopic hematuria: Secondary | ICD-10-CM | POA: Diagnosis not present

## 2017-03-15 DIAGNOSIS — E669 Obesity, unspecified: Secondary | ICD-10-CM | POA: Diagnosis not present

## 2017-03-15 DIAGNOSIS — I679 Cerebrovascular disease, unspecified: Secondary | ICD-10-CM | POA: Diagnosis not present

## 2017-03-15 DIAGNOSIS — Z86010 Personal history of colonic polyps: Secondary | ICD-10-CM | POA: Diagnosis not present

## 2017-03-31 DIAGNOSIS — E538 Deficiency of other specified B group vitamins: Secondary | ICD-10-CM | POA: Diagnosis not present

## 2017-05-03 DIAGNOSIS — E538 Deficiency of other specified B group vitamins: Secondary | ICD-10-CM | POA: Diagnosis not present

## 2017-06-15 DIAGNOSIS — E538 Deficiency of other specified B group vitamins: Secondary | ICD-10-CM | POA: Diagnosis not present

## 2017-07-16 DIAGNOSIS — E538 Deficiency of other specified B group vitamins: Secondary | ICD-10-CM | POA: Diagnosis not present

## 2017-07-20 DIAGNOSIS — L601 Onycholysis: Secondary | ICD-10-CM | POA: Diagnosis not present

## 2017-07-20 DIAGNOSIS — Z79899 Other long term (current) drug therapy: Secondary | ICD-10-CM | POA: Diagnosis not present

## 2017-07-20 DIAGNOSIS — Z5181 Encounter for therapeutic drug level monitoring: Secondary | ICD-10-CM | POA: Diagnosis not present

## 2017-07-20 DIAGNOSIS — L661 Lichen planopilaris: Secondary | ICD-10-CM | POA: Diagnosis not present

## 2017-08-02 DIAGNOSIS — R3129 Other microscopic hematuria: Secondary | ICD-10-CM | POA: Diagnosis not present

## 2017-08-02 DIAGNOSIS — D7589 Other specified diseases of blood and blood-forming organs: Secondary | ICD-10-CM | POA: Diagnosis not present

## 2017-08-02 DIAGNOSIS — J4520 Mild intermittent asthma, uncomplicated: Secondary | ICD-10-CM | POA: Diagnosis not present

## 2017-08-02 DIAGNOSIS — J309 Allergic rhinitis, unspecified: Secondary | ICD-10-CM | POA: Diagnosis not present

## 2017-08-02 DIAGNOSIS — Z86010 Personal history of colonic polyps: Secondary | ICD-10-CM | POA: Diagnosis not present

## 2017-08-02 DIAGNOSIS — Z0000 Encounter for general adult medical examination without abnormal findings: Secondary | ICD-10-CM | POA: Diagnosis not present

## 2017-08-02 DIAGNOSIS — E538 Deficiency of other specified B group vitamins: Secondary | ICD-10-CM | POA: Diagnosis not present

## 2017-08-02 DIAGNOSIS — E039 Hypothyroidism, unspecified: Secondary | ICD-10-CM | POA: Diagnosis not present

## 2017-08-02 DIAGNOSIS — I679 Cerebrovascular disease, unspecified: Secondary | ICD-10-CM | POA: Diagnosis not present

## 2017-08-02 DIAGNOSIS — Z79899 Other long term (current) drug therapy: Secondary | ICD-10-CM | POA: Diagnosis not present

## 2017-08-02 DIAGNOSIS — Z1389 Encounter for screening for other disorder: Secondary | ICD-10-CM | POA: Diagnosis not present

## 2017-08-17 DIAGNOSIS — E538 Deficiency of other specified B group vitamins: Secondary | ICD-10-CM | POA: Diagnosis not present

## 2017-09-27 DIAGNOSIS — H43813 Vitreous degeneration, bilateral: Secondary | ICD-10-CM | POA: Diagnosis not present

## 2017-09-27 DIAGNOSIS — H353121 Nonexudative age-related macular degeneration, left eye, early dry stage: Secondary | ICD-10-CM | POA: Diagnosis not present

## 2017-09-27 DIAGNOSIS — H52203 Unspecified astigmatism, bilateral: Secondary | ICD-10-CM | POA: Diagnosis not present

## 2017-09-27 DIAGNOSIS — Z961 Presence of intraocular lens: Secondary | ICD-10-CM | POA: Diagnosis not present

## 2017-09-30 DIAGNOSIS — Z1231 Encounter for screening mammogram for malignant neoplasm of breast: Secondary | ICD-10-CM | POA: Diagnosis not present

## 2017-09-30 DIAGNOSIS — N952 Postmenopausal atrophic vaginitis: Secondary | ICD-10-CM | POA: Diagnosis not present

## 2017-09-30 DIAGNOSIS — A6000 Herpesviral infection of urogenital system, unspecified: Secondary | ICD-10-CM | POA: Diagnosis not present

## 2017-09-30 DIAGNOSIS — L900 Lichen sclerosus et atrophicus: Secondary | ICD-10-CM | POA: Diagnosis not present

## 2017-09-30 DIAGNOSIS — Z779 Other contact with and (suspected) exposures hazardous to health: Secondary | ICD-10-CM | POA: Diagnosis not present

## 2017-09-30 DIAGNOSIS — M8580 Other specified disorders of bone density and structure, unspecified site: Secondary | ICD-10-CM | POA: Diagnosis not present

## 2017-11-08 DIAGNOSIS — R350 Frequency of micturition: Secondary | ICD-10-CM | POA: Diagnosis not present

## 2017-11-08 DIAGNOSIS — N281 Cyst of kidney, acquired: Secondary | ICD-10-CM | POA: Diagnosis not present

## 2017-11-08 DIAGNOSIS — R311 Benign essential microscopic hematuria: Secondary | ICD-10-CM | POA: Diagnosis not present

## 2017-11-22 DIAGNOSIS — E538 Deficiency of other specified B group vitamins: Secondary | ICD-10-CM | POA: Diagnosis not present

## 2017-11-22 DIAGNOSIS — M7061 Trochanteric bursitis, right hip: Secondary | ICD-10-CM | POA: Diagnosis not present

## 2017-11-22 DIAGNOSIS — Z23 Encounter for immunization: Secondary | ICD-10-CM | POA: Diagnosis not present

## 2017-12-21 DIAGNOSIS — L661 Lichen planopilaris: Secondary | ICD-10-CM | POA: Diagnosis not present

## 2017-12-21 DIAGNOSIS — Z5181 Encounter for therapeutic drug level monitoring: Secondary | ICD-10-CM | POA: Diagnosis not present

## 2017-12-21 DIAGNOSIS — L658 Other specified nonscarring hair loss: Secondary | ICD-10-CM | POA: Diagnosis not present

## 2017-12-27 DIAGNOSIS — E538 Deficiency of other specified B group vitamins: Secondary | ICD-10-CM | POA: Diagnosis not present

## 2018-04-12 DIAGNOSIS — M5431 Sciatica, right side: Secondary | ICD-10-CM | POA: Diagnosis not present

## 2018-04-25 DIAGNOSIS — S8391XD Sprain of unspecified site of right knee, subsequent encounter: Secondary | ICD-10-CM | POA: Diagnosis not present

## 2018-04-25 DIAGNOSIS — M5431 Sciatica, right side: Secondary | ICD-10-CM | POA: Diagnosis not present

## 2018-04-25 DIAGNOSIS — S76011D Strain of muscle, fascia and tendon of right hip, subsequent encounter: Secondary | ICD-10-CM | POA: Diagnosis not present

## 2018-04-29 DIAGNOSIS — M5431 Sciatica, right side: Secondary | ICD-10-CM | POA: Diagnosis not present

## 2018-04-29 DIAGNOSIS — S8391XD Sprain of unspecified site of right knee, subsequent encounter: Secondary | ICD-10-CM | POA: Diagnosis not present

## 2018-04-29 DIAGNOSIS — S76011D Strain of muscle, fascia and tendon of right hip, subsequent encounter: Secondary | ICD-10-CM | POA: Diagnosis not present

## 2018-05-02 DIAGNOSIS — S8391XD Sprain of unspecified site of right knee, subsequent encounter: Secondary | ICD-10-CM | POA: Diagnosis not present

## 2018-05-02 DIAGNOSIS — S76011D Strain of muscle, fascia and tendon of right hip, subsequent encounter: Secondary | ICD-10-CM | POA: Diagnosis not present

## 2018-05-02 DIAGNOSIS — M5431 Sciatica, right side: Secondary | ICD-10-CM | POA: Diagnosis not present

## 2018-05-13 DIAGNOSIS — S8391XD Sprain of unspecified site of right knee, subsequent encounter: Secondary | ICD-10-CM | POA: Diagnosis not present

## 2018-05-13 DIAGNOSIS — M5431 Sciatica, right side: Secondary | ICD-10-CM | POA: Diagnosis not present

## 2018-05-13 DIAGNOSIS — S76011D Strain of muscle, fascia and tendon of right hip, subsequent encounter: Secondary | ICD-10-CM | POA: Diagnosis not present

## 2018-05-24 DIAGNOSIS — Z5181 Encounter for therapeutic drug level monitoring: Secondary | ICD-10-CM | POA: Diagnosis not present

## 2018-05-24 DIAGNOSIS — L661 Lichen planopilaris: Secondary | ICD-10-CM | POA: Diagnosis not present

## 2018-05-27 DIAGNOSIS — S8391XD Sprain of unspecified site of right knee, subsequent encounter: Secondary | ICD-10-CM | POA: Diagnosis not present

## 2018-05-27 DIAGNOSIS — M5431 Sciatica, right side: Secondary | ICD-10-CM | POA: Diagnosis not present

## 2018-05-27 DIAGNOSIS — S76011D Strain of muscle, fascia and tendon of right hip, subsequent encounter: Secondary | ICD-10-CM | POA: Diagnosis not present

## 2018-08-20 IMAGING — US US RENAL
1 series · 14 of 25 positions shown · non-contrast
Comparison: Prior abdominal ultrasound 01/10/2014

CLINICAL DATA: 71-year-old female with a 1 year history of
microhematuria

EXAM:
RENAL / URINARY TRACT ULTRASOUND COMPLETE

[Series 1: us renal · 0.23mm/px · 14 of 42 slices shown]
[im 1/42]
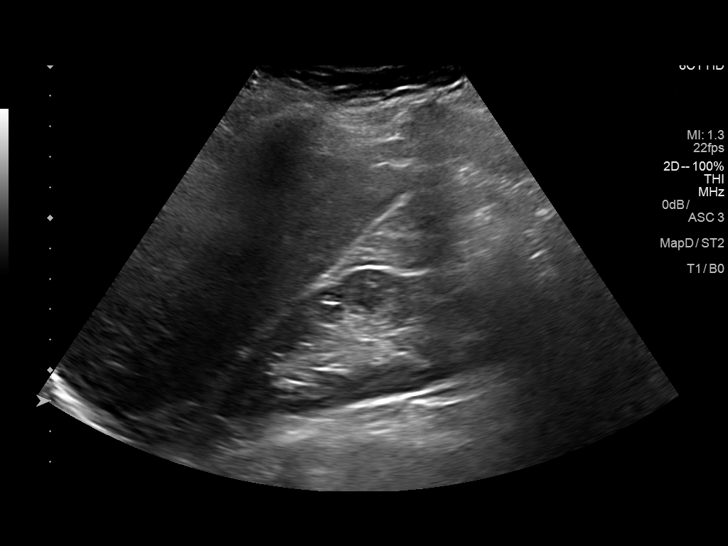
[im 4/42]
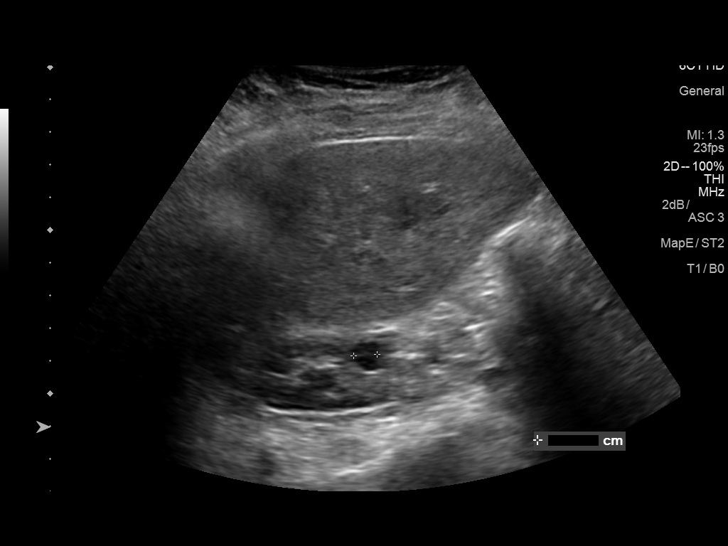
[im 7/42]
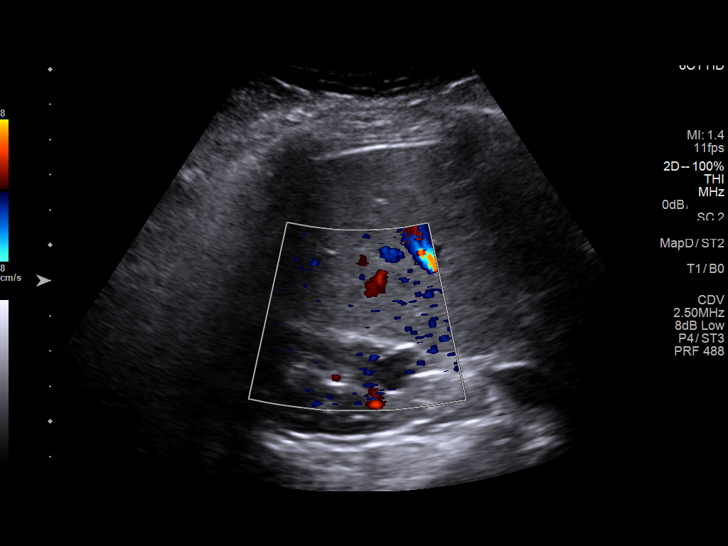
[im 11/42]
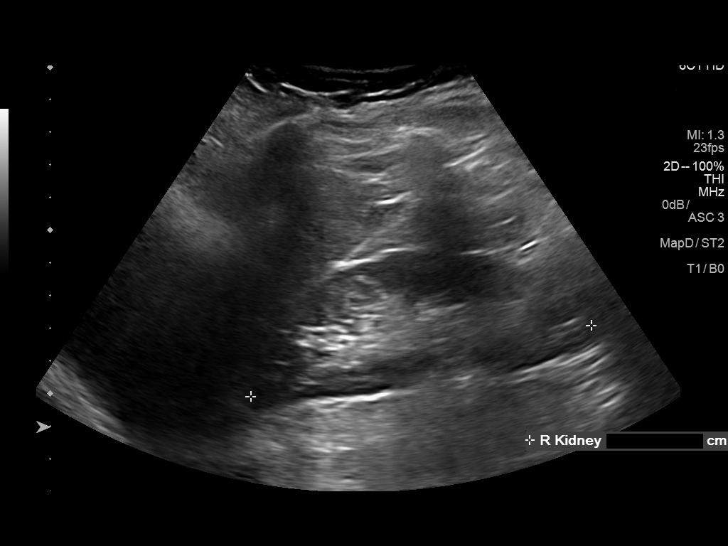
[im 14/42]
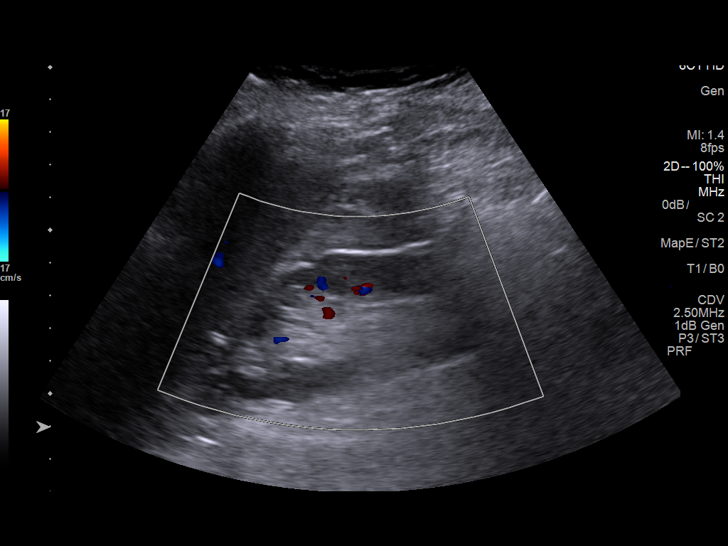
[im 16/42]
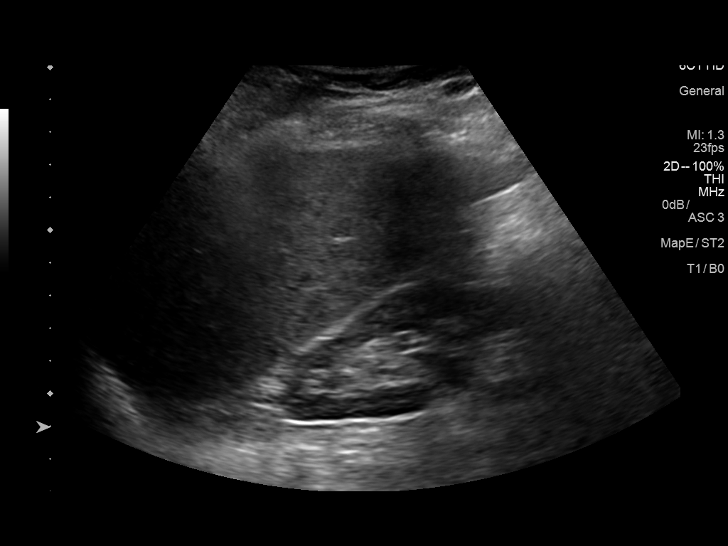
[im 19/42]
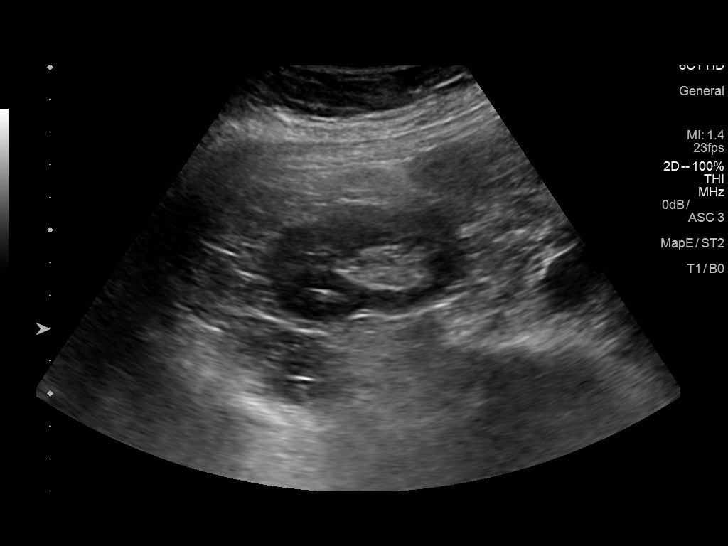
[im 23/42]
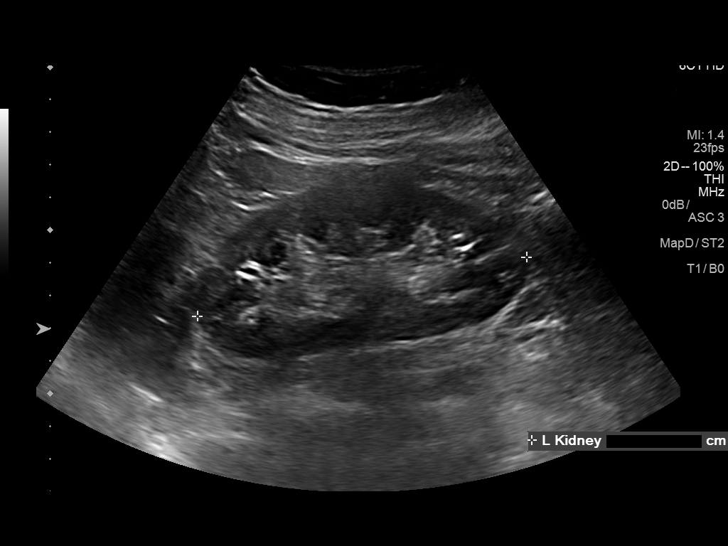
[im 26/42]
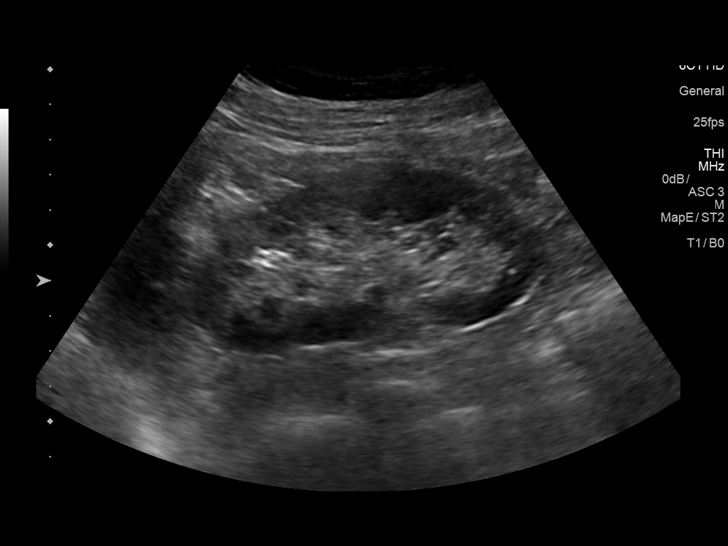
[im 28/42]
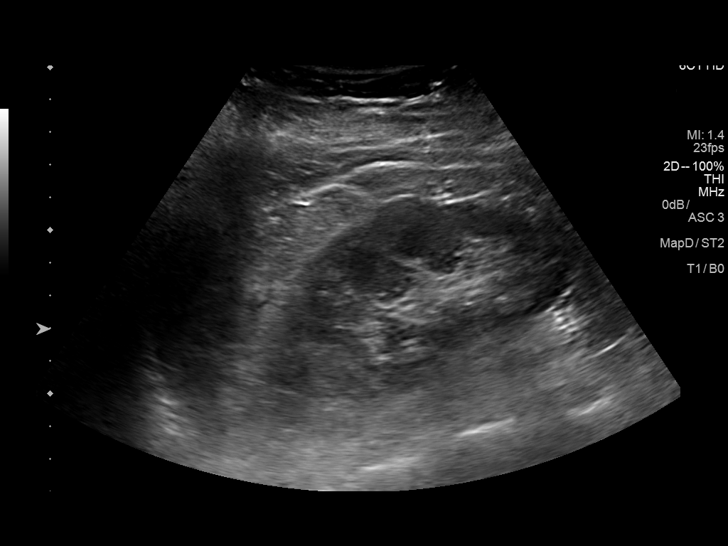
[im 31/42]
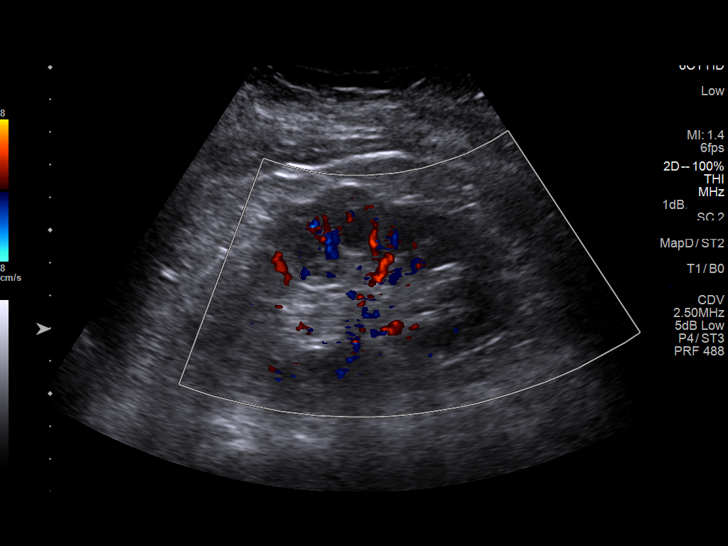
[im 35/42]
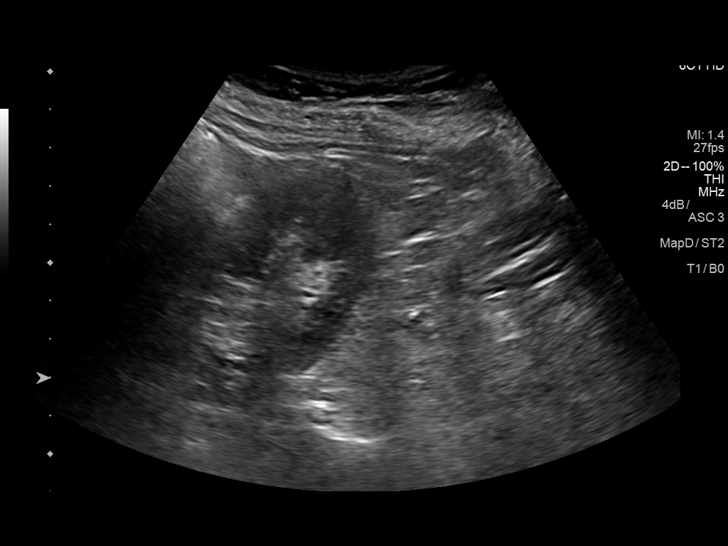
[im 38/42]
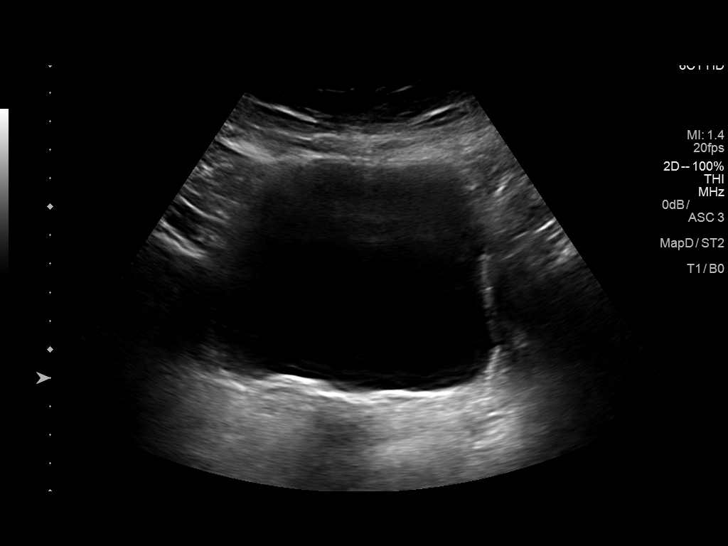
[im 42/42]
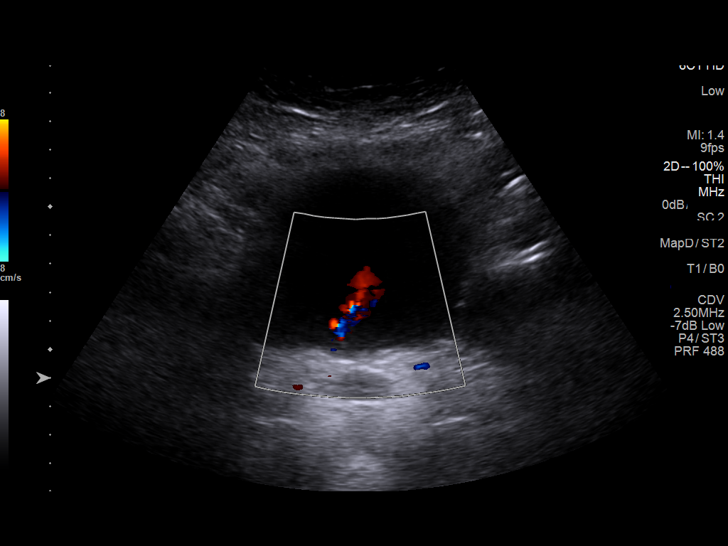

[14 of 25 positions shown; findings below may reference images not displayed]

FINDINGS: Right Kidney:

Length: 11.5 cm. The echogenicity is within normal limits. There is
a circumscribed anechoic simple cyst in the upper pole at the
junction of the upper and interpolar regions measuring 0.9 x 0.9 x
0.7 cm.

Left Kidney:

Length: 10.2 cm. Echogenicity within normal limits. No mass or
hydronephrosis visualized.

Bladder:

Appears normal for degree of bladder distention. Both ureteral jets
are identified.
IMPRESSION: 1. No evidence of hydronephrosis, significant renal cortical
thinning or scarring.
2. Small subcentimeter simple cyst at the junction of the right
upper and interpolar regions.

## 2018-09-06 DIAGNOSIS — S76011D Strain of muscle, fascia and tendon of right hip, subsequent encounter: Secondary | ICD-10-CM | POA: Diagnosis not present

## 2018-09-06 DIAGNOSIS — S8391XD Sprain of unspecified site of right knee, subsequent encounter: Secondary | ICD-10-CM | POA: Diagnosis not present

## 2018-09-06 DIAGNOSIS — M5431 Sciatica, right side: Secondary | ICD-10-CM | POA: Diagnosis not present

## 2018-09-13 DIAGNOSIS — M5431 Sciatica, right side: Secondary | ICD-10-CM | POA: Diagnosis not present

## 2018-09-13 DIAGNOSIS — S76011D Strain of muscle, fascia and tendon of right hip, subsequent encounter: Secondary | ICD-10-CM | POA: Diagnosis not present

## 2018-09-13 DIAGNOSIS — S8391XD Sprain of unspecified site of right knee, subsequent encounter: Secondary | ICD-10-CM | POA: Diagnosis not present

## 2018-09-21 DIAGNOSIS — L900 Lichen sclerosus et atrophicus: Secondary | ICD-10-CM | POA: Diagnosis not present

## 2018-09-21 DIAGNOSIS — Z79899 Other long term (current) drug therapy: Secondary | ICD-10-CM | POA: Diagnosis not present

## 2018-09-21 DIAGNOSIS — J309 Allergic rhinitis, unspecified: Secondary | ICD-10-CM | POA: Diagnosis not present

## 2018-09-21 DIAGNOSIS — E039 Hypothyroidism, unspecified: Secondary | ICD-10-CM | POA: Diagnosis not present

## 2018-09-21 DIAGNOSIS — J4520 Mild intermittent asthma, uncomplicated: Secondary | ICD-10-CM | POA: Diagnosis not present

## 2018-09-21 DIAGNOSIS — Z0001 Encounter for general adult medical examination with abnormal findings: Secondary | ICD-10-CM | POA: Diagnosis not present

## 2018-09-21 DIAGNOSIS — Z1389 Encounter for screening for other disorder: Secondary | ICD-10-CM | POA: Diagnosis not present

## 2018-09-21 DIAGNOSIS — D7589 Other specified diseases of blood and blood-forming organs: Secondary | ICD-10-CM | POA: Diagnosis not present

## 2018-09-21 DIAGNOSIS — E669 Obesity, unspecified: Secondary | ICD-10-CM | POA: Diagnosis not present

## 2018-09-21 DIAGNOSIS — M5431 Sciatica, right side: Secondary | ICD-10-CM | POA: Diagnosis not present

## 2018-09-21 DIAGNOSIS — I679 Cerebrovascular disease, unspecified: Secondary | ICD-10-CM | POA: Diagnosis not present

## 2018-09-21 DIAGNOSIS — E538 Deficiency of other specified B group vitamins: Secondary | ICD-10-CM | POA: Diagnosis not present

## 2018-09-22 DIAGNOSIS — M5431 Sciatica, right side: Secondary | ICD-10-CM | POA: Diagnosis not present

## 2018-09-22 DIAGNOSIS — S8391XD Sprain of unspecified site of right knee, subsequent encounter: Secondary | ICD-10-CM | POA: Diagnosis not present

## 2018-09-22 DIAGNOSIS — S76011D Strain of muscle, fascia and tendon of right hip, subsequent encounter: Secondary | ICD-10-CM | POA: Diagnosis not present

## 2018-09-23 DIAGNOSIS — E039 Hypothyroidism, unspecified: Secondary | ICD-10-CM | POA: Diagnosis not present

## 2018-09-23 DIAGNOSIS — L900 Lichen sclerosus et atrophicus: Secondary | ICD-10-CM | POA: Diagnosis not present

## 2018-09-23 DIAGNOSIS — Z79899 Other long term (current) drug therapy: Secondary | ICD-10-CM | POA: Diagnosis not present

## 2018-09-23 DIAGNOSIS — D7589 Other specified diseases of blood and blood-forming organs: Secondary | ICD-10-CM | POA: Diagnosis not present

## 2018-09-30 DIAGNOSIS — Z79899 Other long term (current) drug therapy: Secondary | ICD-10-CM | POA: Diagnosis not present

## 2018-09-30 DIAGNOSIS — L658 Other specified nonscarring hair loss: Secondary | ICD-10-CM | POA: Diagnosis not present

## 2018-09-30 DIAGNOSIS — S76011D Strain of muscle, fascia and tendon of right hip, subsequent encounter: Secondary | ICD-10-CM | POA: Diagnosis not present

## 2018-09-30 DIAGNOSIS — S8391XD Sprain of unspecified site of right knee, subsequent encounter: Secondary | ICD-10-CM | POA: Diagnosis not present

## 2018-09-30 DIAGNOSIS — L661 Lichen planopilaris: Secondary | ICD-10-CM | POA: Diagnosis not present

## 2018-09-30 DIAGNOSIS — M5431 Sciatica, right side: Secondary | ICD-10-CM | POA: Diagnosis not present

## 2018-10-05 DIAGNOSIS — S8391XD Sprain of unspecified site of right knee, subsequent encounter: Secondary | ICD-10-CM | POA: Diagnosis not present

## 2018-10-05 DIAGNOSIS — M5431 Sciatica, right side: Secondary | ICD-10-CM | POA: Diagnosis not present

## 2018-10-05 DIAGNOSIS — S76011D Strain of muscle, fascia and tendon of right hip, subsequent encounter: Secondary | ICD-10-CM | POA: Diagnosis not present

## 2018-10-07 DIAGNOSIS — M5431 Sciatica, right side: Secondary | ICD-10-CM | POA: Diagnosis not present

## 2018-10-07 DIAGNOSIS — S8391XD Sprain of unspecified site of right knee, subsequent encounter: Secondary | ICD-10-CM | POA: Diagnosis not present

## 2018-10-07 DIAGNOSIS — S76011D Strain of muscle, fascia and tendon of right hip, subsequent encounter: Secondary | ICD-10-CM | POA: Diagnosis not present

## 2018-10-12 DIAGNOSIS — S8391XD Sprain of unspecified site of right knee, subsequent encounter: Secondary | ICD-10-CM | POA: Diagnosis not present

## 2018-10-12 DIAGNOSIS — S76011D Strain of muscle, fascia and tendon of right hip, subsequent encounter: Secondary | ICD-10-CM | POA: Diagnosis not present

## 2018-10-12 DIAGNOSIS — M5431 Sciatica, right side: Secondary | ICD-10-CM | POA: Diagnosis not present

## 2018-10-14 DIAGNOSIS — M5431 Sciatica, right side: Secondary | ICD-10-CM | POA: Diagnosis not present

## 2018-10-14 DIAGNOSIS — S76011D Strain of muscle, fascia and tendon of right hip, subsequent encounter: Secondary | ICD-10-CM | POA: Diagnosis not present

## 2018-10-14 DIAGNOSIS — S8391XD Sprain of unspecified site of right knee, subsequent encounter: Secondary | ICD-10-CM | POA: Diagnosis not present

## 2018-10-19 DIAGNOSIS — M5431 Sciatica, right side: Secondary | ICD-10-CM | POA: Diagnosis not present

## 2018-10-19 DIAGNOSIS — S76011D Strain of muscle, fascia and tendon of right hip, subsequent encounter: Secondary | ICD-10-CM | POA: Diagnosis not present

## 2018-10-19 DIAGNOSIS — S8391XD Sprain of unspecified site of right knee, subsequent encounter: Secondary | ICD-10-CM | POA: Diagnosis not present

## 2018-10-21 DIAGNOSIS — M5431 Sciatica, right side: Secondary | ICD-10-CM | POA: Diagnosis not present

## 2018-10-21 DIAGNOSIS — S76011D Strain of muscle, fascia and tendon of right hip, subsequent encounter: Secondary | ICD-10-CM | POA: Diagnosis not present

## 2018-10-21 DIAGNOSIS — S8391XD Sprain of unspecified site of right knee, subsequent encounter: Secondary | ICD-10-CM | POA: Diagnosis not present

## 2018-10-26 DIAGNOSIS — S8391XD Sprain of unspecified site of right knee, subsequent encounter: Secondary | ICD-10-CM | POA: Diagnosis not present

## 2018-10-26 DIAGNOSIS — M5431 Sciatica, right side: Secondary | ICD-10-CM | POA: Diagnosis not present

## 2018-10-26 DIAGNOSIS — S76011D Strain of muscle, fascia and tendon of right hip, subsequent encounter: Secondary | ICD-10-CM | POA: Diagnosis not present

## 2018-10-26 DIAGNOSIS — Z23 Encounter for immunization: Secondary | ICD-10-CM | POA: Diagnosis not present

## 2018-10-28 ENCOUNTER — Other Ambulatory Visit: Payer: Self-pay | Admitting: Obstetrics

## 2018-10-28 DIAGNOSIS — Z1231 Encounter for screening mammogram for malignant neoplasm of breast: Secondary | ICD-10-CM | POA: Diagnosis not present

## 2018-10-28 DIAGNOSIS — S76011D Strain of muscle, fascia and tendon of right hip, subsequent encounter: Secondary | ICD-10-CM | POA: Diagnosis not present

## 2018-10-28 DIAGNOSIS — N952 Postmenopausal atrophic vaginitis: Secondary | ICD-10-CM | POA: Diagnosis not present

## 2018-10-28 DIAGNOSIS — M810 Age-related osteoporosis without current pathological fracture: Secondary | ICD-10-CM

## 2018-10-28 DIAGNOSIS — L900 Lichen sclerosus et atrophicus: Secondary | ICD-10-CM | POA: Diagnosis not present

## 2018-10-28 DIAGNOSIS — Z779 Other contact with and (suspected) exposures hazardous to health: Secondary | ICD-10-CM | POA: Diagnosis not present

## 2018-10-28 DIAGNOSIS — M5431 Sciatica, right side: Secondary | ICD-10-CM | POA: Diagnosis not present

## 2018-10-28 DIAGNOSIS — Z01419 Encounter for gynecological examination (general) (routine) without abnormal findings: Secondary | ICD-10-CM | POA: Diagnosis not present

## 2018-10-28 DIAGNOSIS — A6004 Herpesviral vulvovaginitis: Secondary | ICD-10-CM | POA: Diagnosis not present

## 2018-10-28 DIAGNOSIS — M8589 Other specified disorders of bone density and structure, multiple sites: Secondary | ICD-10-CM | POA: Diagnosis not present

## 2018-10-28 DIAGNOSIS — S8391XD Sprain of unspecified site of right knee, subsequent encounter: Secondary | ICD-10-CM | POA: Diagnosis not present

## 2018-10-31 DIAGNOSIS — S76011D Strain of muscle, fascia and tendon of right hip, subsequent encounter: Secondary | ICD-10-CM | POA: Diagnosis not present

## 2018-10-31 DIAGNOSIS — S8391XD Sprain of unspecified site of right knee, subsequent encounter: Secondary | ICD-10-CM | POA: Diagnosis not present

## 2018-10-31 DIAGNOSIS — M5431 Sciatica, right side: Secondary | ICD-10-CM | POA: Diagnosis not present

## 2018-11-01 ENCOUNTER — Ambulatory Visit
Admission: RE | Admit: 2018-11-01 | Discharge: 2018-11-01 | Disposition: A | Discharge status: Home / Self Care | Payer: Medicare Other | Source: Ambulatory Visit | Attending: Obstetrics | Admitting: Obstetrics

## 2018-11-01 ENCOUNTER — Other Ambulatory Visit: Payer: Self-pay

## 2018-11-01 DIAGNOSIS — Z780 Asymptomatic menopausal state: Secondary | ICD-10-CM | POA: Diagnosis not present

## 2018-11-01 DIAGNOSIS — M8589 Other specified disorders of bone density and structure, multiple sites: Secondary | ICD-10-CM | POA: Diagnosis not present

## 2018-11-01 DIAGNOSIS — M810 Age-related osteoporosis without current pathological fracture: Secondary | ICD-10-CM

## 2018-11-02 DIAGNOSIS — S8391XD Sprain of unspecified site of right knee, subsequent encounter: Secondary | ICD-10-CM | POA: Diagnosis not present

## 2018-11-02 DIAGNOSIS — S76011D Strain of muscle, fascia and tendon of right hip, subsequent encounter: Secondary | ICD-10-CM | POA: Diagnosis not present

## 2018-11-02 DIAGNOSIS — M5431 Sciatica, right side: Secondary | ICD-10-CM | POA: Diagnosis not present

## 2018-11-03 DIAGNOSIS — E039 Hypothyroidism, unspecified: Secondary | ICD-10-CM | POA: Diagnosis not present

## 2018-11-03 DIAGNOSIS — J4520 Mild intermittent asthma, uncomplicated: Secondary | ICD-10-CM | POA: Diagnosis not present

## 2018-11-07 DIAGNOSIS — S8391XD Sprain of unspecified site of right knee, subsequent encounter: Secondary | ICD-10-CM | POA: Diagnosis not present

## 2018-11-07 DIAGNOSIS — S76011D Strain of muscle, fascia and tendon of right hip, subsequent encounter: Secondary | ICD-10-CM | POA: Diagnosis not present

## 2018-11-07 DIAGNOSIS — M8589 Other specified disorders of bone density and structure, multiple sites: Secondary | ICD-10-CM | POA: Diagnosis not present

## 2018-11-07 DIAGNOSIS — M5431 Sciatica, right side: Secondary | ICD-10-CM | POA: Diagnosis not present

## 2018-11-09 DIAGNOSIS — M5431 Sciatica, right side: Secondary | ICD-10-CM | POA: Diagnosis not present

## 2018-11-09 DIAGNOSIS — S76011D Strain of muscle, fascia and tendon of right hip, subsequent encounter: Secondary | ICD-10-CM | POA: Diagnosis not present

## 2018-11-09 DIAGNOSIS — S8391XD Sprain of unspecified site of right knee, subsequent encounter: Secondary | ICD-10-CM | POA: Diagnosis not present

## 2018-11-14 DIAGNOSIS — S8391XD Sprain of unspecified site of right knee, subsequent encounter: Secondary | ICD-10-CM | POA: Diagnosis not present

## 2018-11-14 DIAGNOSIS — S76011D Strain of muscle, fascia and tendon of right hip, subsequent encounter: Secondary | ICD-10-CM | POA: Diagnosis not present

## 2018-11-14 DIAGNOSIS — M5431 Sciatica, right side: Secondary | ICD-10-CM | POA: Diagnosis not present

## 2018-11-16 DIAGNOSIS — M5431 Sciatica, right side: Secondary | ICD-10-CM | POA: Diagnosis not present

## 2018-11-16 DIAGNOSIS — S8391XD Sprain of unspecified site of right knee, subsequent encounter: Secondary | ICD-10-CM | POA: Diagnosis not present

## 2018-11-16 DIAGNOSIS — S76011D Strain of muscle, fascia and tendon of right hip, subsequent encounter: Secondary | ICD-10-CM | POA: Diagnosis not present

## 2018-11-21 DIAGNOSIS — S76011D Strain of muscle, fascia and tendon of right hip, subsequent encounter: Secondary | ICD-10-CM | POA: Diagnosis not present

## 2018-11-21 DIAGNOSIS — S8391XD Sprain of unspecified site of right knee, subsequent encounter: Secondary | ICD-10-CM | POA: Diagnosis not present

## 2018-11-21 DIAGNOSIS — M5431 Sciatica, right side: Secondary | ICD-10-CM | POA: Diagnosis not present

## 2018-11-26 DIAGNOSIS — Z20828 Contact with and (suspected) exposure to other viral communicable diseases: Secondary | ICD-10-CM | POA: Diagnosis not present

## 2018-12-14 DIAGNOSIS — H04123 Dry eye syndrome of bilateral lacrimal glands: Secondary | ICD-10-CM | POA: Diagnosis not present

## 2018-12-14 DIAGNOSIS — H52203 Unspecified astigmatism, bilateral: Secondary | ICD-10-CM | POA: Diagnosis not present

## 2018-12-14 DIAGNOSIS — Z961 Presence of intraocular lens: Secondary | ICD-10-CM | POA: Diagnosis not present

## 2018-12-14 DIAGNOSIS — H43813 Vitreous degeneration, bilateral: Secondary | ICD-10-CM | POA: Diagnosis not present

## 2018-12-26 DIAGNOSIS — Z23 Encounter for immunization: Secondary | ICD-10-CM | POA: Diagnosis not present

## 2019-01-09 DIAGNOSIS — M1712 Unilateral primary osteoarthritis, left knee: Secondary | ICD-10-CM | POA: Diagnosis not present

## 2019-01-09 DIAGNOSIS — M25551 Pain in right hip: Secondary | ICD-10-CM | POA: Diagnosis not present

## 2019-01-09 DIAGNOSIS — M1711 Unilateral primary osteoarthritis, right knee: Secondary | ICD-10-CM | POA: Diagnosis not present

## 2019-01-09 DIAGNOSIS — M545 Low back pain: Secondary | ICD-10-CM | POA: Diagnosis not present

## 2019-02-22 DIAGNOSIS — M545 Low back pain: Secondary | ICD-10-CM | POA: Diagnosis not present

## 2019-03-07 DIAGNOSIS — E278 Other specified disorders of adrenal gland: Secondary | ICD-10-CM | POA: Diagnosis not present

## 2019-03-14 DIAGNOSIS — M5416 Radiculopathy, lumbar region: Secondary | ICD-10-CM | POA: Diagnosis not present

## 2019-03-18 ENCOUNTER — Ambulatory Visit: Payer: Medicare Other

## 2019-03-23 ENCOUNTER — Ambulatory Visit: Payer: Medicare Other | Attending: Internal Medicine

## 2019-03-23 ENCOUNTER — Ambulatory Visit: Payer: Medicare Other

## 2019-03-23 DIAGNOSIS — Z23 Encounter for immunization: Secondary | ICD-10-CM | POA: Insufficient documentation

## 2019-03-23 NOTE — Progress Notes (Signed)
   Covid-19 Vaccination Clinic  Name:  Melissa Craig    MRN: [id number] DOB: 01-16-1946  03/23/2019  [name] was observed post Covid-19 immunization for 15 minutes without incidence. She was provided with Vaccine Information Sheet and instruction to access the V-Safe system.   [name] was instructed to call 911 with any severe reactions post vaccine: Marland Kitchen Difficulty breathing  . Swelling of your face and throat  . A fast heartbeat  . A bad rash all over your body  . Dizziness and weakness    Immunizations Administered    Name Date Dose VIS Date Route   Pfizer COVID-19 Vaccine 03/23/2019 11:14 AM 0.3 mL 01/27/2019 Intramuscular   Manufacturer: [place]   Lot: [id number]   [place]: [id number]

## 2019-03-24 ENCOUNTER — Other Ambulatory Visit: Payer: Self-pay | Admitting: Internal Medicine

## 2019-03-24 DIAGNOSIS — D3502 Benign neoplasm of left adrenal gland: Secondary | ICD-10-CM

## 2019-03-30 DIAGNOSIS — E278 Other specified disorders of adrenal gland: Secondary | ICD-10-CM | POA: Diagnosis not present

## 2019-04-10 DIAGNOSIS — M5416 Radiculopathy, lumbar region: Secondary | ICD-10-CM | POA: Diagnosis not present

## 2019-04-12 ENCOUNTER — Ambulatory Visit
Admission: RE | Admit: 2019-04-12 | Discharge: 2019-04-12 | Disposition: A | Discharge status: Home / Self Care | Payer: Medicare Other | Source: Ambulatory Visit | Attending: Internal Medicine | Admitting: Internal Medicine

## 2019-04-12 ENCOUNTER — Other Ambulatory Visit: Payer: Self-pay

## 2019-04-12 DIAGNOSIS — D3502 Benign neoplasm of left adrenal gland: Secondary | ICD-10-CM

## 2019-04-12 DIAGNOSIS — K314 Gastric diverticulum: Secondary | ICD-10-CM | POA: Diagnosis not present

## 2019-04-12 DIAGNOSIS — K862 Cyst of pancreas: Secondary | ICD-10-CM | POA: Diagnosis not present

## 2019-04-12 MED ORDER — GADOBENATE DIMEGLUMINE 529 MG/ML IV SOLN
19.0000 mL | Freq: Once | INTRAVENOUS | Status: AC | PRN
Start: 2019-04-12 — End: 2019-04-12
  Administered 2019-04-12: 19 mL via INTRAVENOUS

## 2019-04-17 ENCOUNTER — Ambulatory Visit: Payer: Medicare Other | Attending: Internal Medicine

## 2019-04-17 DIAGNOSIS — Z23 Encounter for immunization: Secondary | ICD-10-CM | POA: Insufficient documentation

## 2019-04-17 NOTE — Progress Notes (Signed)
   Covid-19 Vaccination Clinic  Name:  Katalea Rajchel    MRN: [id number] DOB: Nov 11, 1945  04/17/2019  Ms. Lobley was observed post Covid-19 immunization for 15 minutes without incidence. She was provided with Vaccine Information Sheet and instruction to access the V-Safe system.   Ms. Hohenberger was instructed to call 911 with any severe reactions post vaccine: Marland Kitchen Difficulty breathing  . Swelling of your face and throat  . A fast heartbeat  . A bad rash all over your body  . Dizziness and weakness    Immunizations Administered    Name Date Dose VIS Date Route   Pfizer COVID-19 Vaccine 04/17/2019 10:07 AM 0.3 mL 01/27/2019 Intramuscular   Manufacturer: [place]   Lot: [id number]   [place]: [id number]

## 2019-04-19 DIAGNOSIS — K862 Cyst of pancreas: Secondary | ICD-10-CM | POA: Diagnosis not present

## 2019-06-05 DIAGNOSIS — M5416 Radiculopathy, lumbar region: Secondary | ICD-10-CM | POA: Diagnosis not present

## 2019-06-07 DIAGNOSIS — M25551 Pain in right hip: Secondary | ICD-10-CM | POA: Diagnosis not present

## 2019-06-07 DIAGNOSIS — M545 Low back pain: Secondary | ICD-10-CM | POA: Diagnosis not present

## 2019-06-07 DIAGNOSIS — M6281 Muscle weakness (generalized): Secondary | ICD-10-CM | POA: Diagnosis not present

## 2019-06-07 DIAGNOSIS — M5416 Radiculopathy, lumbar region: Secondary | ICD-10-CM | POA: Diagnosis not present

## 2019-06-08 DIAGNOSIS — B372 Candidiasis of skin and nail: Secondary | ICD-10-CM | POA: Diagnosis not present

## 2019-06-09 DIAGNOSIS — M6281 Muscle weakness (generalized): Secondary | ICD-10-CM | POA: Diagnosis not present

## 2019-06-09 DIAGNOSIS — R2689 Other abnormalities of gait and mobility: Secondary | ICD-10-CM | POA: Diagnosis not present

## 2019-06-09 DIAGNOSIS — M25551 Pain in right hip: Secondary | ICD-10-CM | POA: Diagnosis not present

## 2019-06-09 DIAGNOSIS — M545 Low back pain: Secondary | ICD-10-CM | POA: Diagnosis not present

## 2019-06-13 DIAGNOSIS — M25551 Pain in right hip: Secondary | ICD-10-CM | POA: Diagnosis not present

## 2019-06-13 DIAGNOSIS — M545 Low back pain: Secondary | ICD-10-CM | POA: Diagnosis not present

## 2019-06-13 DIAGNOSIS — M6281 Muscle weakness (generalized): Secondary | ICD-10-CM | POA: Diagnosis not present

## 2019-06-13 DIAGNOSIS — R2689 Other abnormalities of gait and mobility: Secondary | ICD-10-CM | POA: Diagnosis not present

## 2019-06-15 DIAGNOSIS — M25551 Pain in right hip: Secondary | ICD-10-CM | POA: Diagnosis not present

## 2019-06-15 DIAGNOSIS — M6281 Muscle weakness (generalized): Secondary | ICD-10-CM | POA: Diagnosis not present

## 2019-06-15 DIAGNOSIS — M545 Low back pain: Secondary | ICD-10-CM | POA: Diagnosis not present

## 2019-06-15 DIAGNOSIS — R2689 Other abnormalities of gait and mobility: Secondary | ICD-10-CM | POA: Diagnosis not present

## 2019-06-20 DIAGNOSIS — M545 Low back pain: Secondary | ICD-10-CM | POA: Diagnosis not present

## 2019-06-20 DIAGNOSIS — M25551 Pain in right hip: Secondary | ICD-10-CM | POA: Diagnosis not present

## 2019-06-20 DIAGNOSIS — M6281 Muscle weakness (generalized): Secondary | ICD-10-CM | POA: Diagnosis not present

## 2019-06-20 DIAGNOSIS — R2689 Other abnormalities of gait and mobility: Secondary | ICD-10-CM | POA: Diagnosis not present

## 2019-06-22 DIAGNOSIS — M545 Low back pain: Secondary | ICD-10-CM | POA: Diagnosis not present

## 2019-06-22 DIAGNOSIS — M25551 Pain in right hip: Secondary | ICD-10-CM | POA: Diagnosis not present

## 2019-06-22 DIAGNOSIS — R2689 Other abnormalities of gait and mobility: Secondary | ICD-10-CM | POA: Diagnosis not present

## 2019-06-22 DIAGNOSIS — M6281 Muscle weakness (generalized): Secondary | ICD-10-CM | POA: Diagnosis not present

## 2019-06-27 DIAGNOSIS — M25551 Pain in right hip: Secondary | ICD-10-CM | POA: Diagnosis not present

## 2019-06-27 DIAGNOSIS — M545 Low back pain: Secondary | ICD-10-CM | POA: Diagnosis not present

## 2019-06-27 DIAGNOSIS — M6281 Muscle weakness (generalized): Secondary | ICD-10-CM | POA: Diagnosis not present

## 2019-06-27 DIAGNOSIS — R2689 Other abnormalities of gait and mobility: Secondary | ICD-10-CM | POA: Diagnosis not present

## 2019-06-29 DIAGNOSIS — R2689 Other abnormalities of gait and mobility: Secondary | ICD-10-CM | POA: Diagnosis not present

## 2019-06-29 DIAGNOSIS — M545 Low back pain: Secondary | ICD-10-CM | POA: Diagnosis not present

## 2019-06-29 DIAGNOSIS — M25551 Pain in right hip: Secondary | ICD-10-CM | POA: Diagnosis not present

## 2019-06-29 DIAGNOSIS — M6281 Muscle weakness (generalized): Secondary | ICD-10-CM | POA: Diagnosis not present

## 2019-07-04 DIAGNOSIS — M25551 Pain in right hip: Secondary | ICD-10-CM | POA: Diagnosis not present

## 2019-07-04 DIAGNOSIS — R2689 Other abnormalities of gait and mobility: Secondary | ICD-10-CM | POA: Diagnosis not present

## 2019-07-04 DIAGNOSIS — M545 Low back pain: Secondary | ICD-10-CM | POA: Diagnosis not present

## 2019-07-04 DIAGNOSIS — M6281 Muscle weakness (generalized): Secondary | ICD-10-CM | POA: Diagnosis not present

## 2019-07-26 DIAGNOSIS — M5416 Radiculopathy, lumbar region: Secondary | ICD-10-CM | POA: Diagnosis not present

## 2019-08-15 DIAGNOSIS — K029 Dental caries, unspecified: Secondary | ICD-10-CM | POA: Diagnosis not present

## 2019-08-18 DIAGNOSIS — K029 Dental caries, unspecified: Secondary | ICD-10-CM | POA: Diagnosis not present

## 2019-09-26 DIAGNOSIS — E039 Hypothyroidism, unspecified: Secondary | ICD-10-CM | POA: Diagnosis not present

## 2019-09-26 DIAGNOSIS — J309 Allergic rhinitis, unspecified: Secondary | ICD-10-CM | POA: Diagnosis not present

## 2019-09-26 DIAGNOSIS — Z86010 Personal history of colonic polyps: Secondary | ICD-10-CM | POA: Diagnosis not present

## 2019-09-26 DIAGNOSIS — I679 Cerebrovascular disease, unspecified: Secondary | ICD-10-CM | POA: Diagnosis not present

## 2019-09-26 DIAGNOSIS — E538 Deficiency of other specified B group vitamins: Secondary | ICD-10-CM | POA: Diagnosis not present

## 2019-09-26 DIAGNOSIS — K5790 Diverticulosis of intestine, part unspecified, without perforation or abscess without bleeding: Secondary | ICD-10-CM | POA: Diagnosis not present

## 2019-09-26 DIAGNOSIS — J4520 Mild intermittent asthma, uncomplicated: Secondary | ICD-10-CM | POA: Diagnosis not present

## 2019-09-26 DIAGNOSIS — B372 Candidiasis of skin and nail: Secondary | ICD-10-CM | POA: Diagnosis not present

## 2019-09-26 DIAGNOSIS — Z7189 Other specified counseling: Secondary | ICD-10-CM | POA: Diagnosis not present

## 2019-09-26 DIAGNOSIS — R3129 Other microscopic hematuria: Secondary | ICD-10-CM | POA: Diagnosis not present

## 2019-09-26 DIAGNOSIS — Z0000 Encounter for general adult medical examination without abnormal findings: Secondary | ICD-10-CM | POA: Diagnosis not present

## 2019-09-26 DIAGNOSIS — K862 Cyst of pancreas: Secondary | ICD-10-CM | POA: Diagnosis not present

## 2019-09-26 DIAGNOSIS — D7589 Other specified diseases of blood and blood-forming organs: Secondary | ICD-10-CM | POA: Diagnosis not present

## 2019-09-26 DIAGNOSIS — Z1389 Encounter for screening for other disorder: Secondary | ICD-10-CM | POA: Diagnosis not present

## 2019-09-26 DIAGNOSIS — M899 Disorder of bone, unspecified: Secondary | ICD-10-CM | POA: Diagnosis not present

## 2019-10-03 DIAGNOSIS — B372 Candidiasis of skin and nail: Secondary | ICD-10-CM | POA: Diagnosis not present

## 2019-10-09 DIAGNOSIS — L304 Erythema intertrigo: Secondary | ICD-10-CM | POA: Diagnosis not present

## 2019-10-12 DIAGNOSIS — L304 Erythema intertrigo: Secondary | ICD-10-CM | POA: Insufficient documentation

## 2019-10-28 DIAGNOSIS — Z20822 Contact with and (suspected) exposure to covid-19: Secondary | ICD-10-CM | POA: Diagnosis not present

## 2019-12-08 DIAGNOSIS — A6000 Herpesviral infection of urogenital system, unspecified: Secondary | ICD-10-CM | POA: Diagnosis not present

## 2019-12-08 DIAGNOSIS — N952 Postmenopausal atrophic vaginitis: Secondary | ICD-10-CM | POA: Diagnosis not present

## 2019-12-08 DIAGNOSIS — B372 Candidiasis of skin and nail: Secondary | ICD-10-CM | POA: Diagnosis not present

## 2019-12-08 DIAGNOSIS — Z779 Other contact with and (suspected) exposures hazardous to health: Secondary | ICD-10-CM | POA: Diagnosis not present

## 2019-12-08 DIAGNOSIS — M8589 Other specified disorders of bone density and structure, multiple sites: Secondary | ICD-10-CM | POA: Diagnosis not present

## 2019-12-08 DIAGNOSIS — Z1231 Encounter for screening mammogram for malignant neoplasm of breast: Secondary | ICD-10-CM | POA: Diagnosis not present

## 2019-12-18 DIAGNOSIS — H43813 Vitreous degeneration, bilateral: Secondary | ICD-10-CM | POA: Diagnosis not present

## 2019-12-18 DIAGNOSIS — H5211 Myopia, right eye: Secondary | ICD-10-CM | POA: Diagnosis not present

## 2019-12-18 DIAGNOSIS — H353121 Nonexudative age-related macular degeneration, left eye, early dry stage: Secondary | ICD-10-CM | POA: Diagnosis not present

## 2019-12-18 DIAGNOSIS — H04123 Dry eye syndrome of bilateral lacrimal glands: Secondary | ICD-10-CM | POA: Diagnosis not present

## 2020-02-20 DIAGNOSIS — L658 Other specified nonscarring hair loss: Secondary | ICD-10-CM | POA: Diagnosis not present

## 2020-02-20 DIAGNOSIS — L661 Lichen planopilaris: Secondary | ICD-10-CM | POA: Diagnosis not present

## 2020-02-24 DIAGNOSIS — Z20822 Contact with and (suspected) exposure to covid-19: Secondary | ICD-10-CM | POA: Diagnosis not present

## 2020-04-25 DIAGNOSIS — R059 Cough, unspecified: Secondary | ICD-10-CM | POA: Diagnosis not present

## 2020-04-25 DIAGNOSIS — R509 Fever, unspecified: Secondary | ICD-10-CM | POA: Diagnosis not present

## 2020-04-25 DIAGNOSIS — R0789 Other chest pain: Secondary | ICD-10-CM | POA: Diagnosis not present

## 2020-06-20 DIAGNOSIS — Z20822 Contact with and (suspected) exposure to covid-19: Secondary | ICD-10-CM | POA: Diagnosis not present

## 2020-06-27 DIAGNOSIS — L509 Urticaria, unspecified: Secondary | ICD-10-CM | POA: Diagnosis not present

## 2020-06-27 DIAGNOSIS — L301 Dyshidrosis [pompholyx]: Secondary | ICD-10-CM | POA: Diagnosis not present

## 2020-08-22 DIAGNOSIS — Z5181 Encounter for therapeutic drug level monitoring: Secondary | ICD-10-CM | POA: Diagnosis not present

## 2020-08-22 DIAGNOSIS — L661 Lichen planopilaris: Secondary | ICD-10-CM | POA: Diagnosis not present

## 2020-08-22 DIAGNOSIS — L658 Other specified nonscarring hair loss: Secondary | ICD-10-CM | POA: Diagnosis not present

## 2020-10-08 DIAGNOSIS — K862 Cyst of pancreas: Secondary | ICD-10-CM | POA: Diagnosis not present

## 2020-10-08 DIAGNOSIS — Z1389 Encounter for screening for other disorder: Secondary | ICD-10-CM | POA: Diagnosis not present

## 2020-10-08 DIAGNOSIS — J309 Allergic rhinitis, unspecified: Secondary | ICD-10-CM | POA: Diagnosis not present

## 2020-10-08 DIAGNOSIS — Z86010 Personal history of colonic polyps: Secondary | ICD-10-CM | POA: Diagnosis not present

## 2020-10-08 DIAGNOSIS — M899 Disorder of bone, unspecified: Secondary | ICD-10-CM | POA: Diagnosis not present

## 2020-10-08 DIAGNOSIS — K5790 Diverticulosis of intestine, part unspecified, without perforation or abscess without bleeding: Secondary | ICD-10-CM | POA: Diagnosis not present

## 2020-10-08 DIAGNOSIS — I679 Cerebrovascular disease, unspecified: Secondary | ICD-10-CM | POA: Diagnosis not present

## 2020-10-08 DIAGNOSIS — E669 Obesity, unspecified: Secondary | ICD-10-CM | POA: Diagnosis not present

## 2020-10-08 DIAGNOSIS — J4520 Mild intermittent asthma, uncomplicated: Secondary | ICD-10-CM | POA: Diagnosis not present

## 2020-10-08 DIAGNOSIS — E538 Deficiency of other specified B group vitamins: Secondary | ICD-10-CM | POA: Diagnosis not present

## 2020-10-08 DIAGNOSIS — E039 Hypothyroidism, unspecified: Secondary | ICD-10-CM | POA: Diagnosis not present

## 2020-10-08 DIAGNOSIS — R3129 Other microscopic hematuria: Secondary | ICD-10-CM | POA: Diagnosis not present

## 2020-10-08 DIAGNOSIS — Z0000 Encounter for general adult medical examination without abnormal findings: Secondary | ICD-10-CM | POA: Diagnosis not present

## 2020-10-08 DIAGNOSIS — Z0183 Encounter for blood typing: Secondary | ICD-10-CM | POA: Diagnosis not present

## 2020-12-20 DIAGNOSIS — H26492 Other secondary cataract, left eye: Secondary | ICD-10-CM | POA: Diagnosis not present

## 2020-12-20 DIAGNOSIS — H353121 Nonexudative age-related macular degeneration, left eye, early dry stage: Secondary | ICD-10-CM | POA: Diagnosis not present

## 2021-02-20 DIAGNOSIS — M8580 Other specified disorders of bone density and structure, unspecified site: Secondary | ICD-10-CM | POA: Diagnosis not present

## 2021-02-20 DIAGNOSIS — N952 Postmenopausal atrophic vaginitis: Secondary | ICD-10-CM | POA: Diagnosis not present

## 2021-02-20 DIAGNOSIS — A609 Anogenital herpesviral infection, unspecified: Secondary | ICD-10-CM | POA: Diagnosis not present

## 2021-02-20 DIAGNOSIS — Z1231 Encounter for screening mammogram for malignant neoplasm of breast: Secondary | ICD-10-CM | POA: Diagnosis not present

## 2021-02-20 DIAGNOSIS — Z779 Other contact with and (suspected) exposures hazardous to health: Secondary | ICD-10-CM | POA: Diagnosis not present

## 2021-02-20 DIAGNOSIS — Z124 Encounter for screening for malignant neoplasm of cervix: Secondary | ICD-10-CM | POA: Diagnosis not present

## 2021-02-20 DIAGNOSIS — Z01419 Encounter for gynecological examination (general) (routine) without abnormal findings: Secondary | ICD-10-CM | POA: Diagnosis not present

## 2021-02-20 DIAGNOSIS — Z6837 Body mass index (BMI) 37.0-37.9, adult: Secondary | ICD-10-CM | POA: Diagnosis not present

## 2021-02-20 DIAGNOSIS — Z01411 Encounter for gynecological examination (general) (routine) with abnormal findings: Secondary | ICD-10-CM | POA: Diagnosis not present

## 2021-02-20 DIAGNOSIS — L900 Lichen sclerosus et atrophicus: Secondary | ICD-10-CM | POA: Diagnosis not present

## 2021-03-06 ENCOUNTER — Other Ambulatory Visit: Payer: Self-pay | Admitting: Obstetrics

## 2021-03-06 DIAGNOSIS — M8580 Other specified disorders of bone density and structure, unspecified site: Secondary | ICD-10-CM

## 2021-03-13 DIAGNOSIS — Z5181 Encounter for therapeutic drug level monitoring: Secondary | ICD-10-CM | POA: Diagnosis not present

## 2021-03-13 DIAGNOSIS — L658 Other specified nonscarring hair loss: Secondary | ICD-10-CM | POA: Diagnosis not present

## 2021-03-13 DIAGNOSIS — L661 Lichen planopilaris: Secondary | ICD-10-CM | POA: Diagnosis not present

## 2021-03-17 ENCOUNTER — Other Ambulatory Visit: Payer: Self-pay | Admitting: Obstetrics

## 2021-03-17 DIAGNOSIS — M8580 Other specified disorders of bone density and structure, unspecified site: Secondary | ICD-10-CM

## 2021-04-09 DIAGNOSIS — M25561 Pain in right knee: Secondary | ICD-10-CM | POA: Diagnosis not present

## 2021-04-09 DIAGNOSIS — Z86010 Personal history of colonic polyps: Secondary | ICD-10-CM | POA: Diagnosis not present

## 2021-04-09 DIAGNOSIS — Z1211 Encounter for screening for malignant neoplasm of colon: Secondary | ICD-10-CM | POA: Diagnosis not present

## 2021-04-09 DIAGNOSIS — K219 Gastro-esophageal reflux disease without esophagitis: Secondary | ICD-10-CM | POA: Diagnosis not present

## 2021-04-14 DIAGNOSIS — Z1211 Encounter for screening for malignant neoplasm of colon: Secondary | ICD-10-CM | POA: Diagnosis not present

## 2021-04-17 DIAGNOSIS — M25561 Pain in right knee: Secondary | ICD-10-CM | POA: Diagnosis not present

## 2021-04-17 DIAGNOSIS — M25661 Stiffness of right knee, not elsewhere classified: Secondary | ICD-10-CM | POA: Diagnosis not present

## 2021-04-17 DIAGNOSIS — M6281 Muscle weakness (generalized): Secondary | ICD-10-CM | POA: Diagnosis not present

## 2021-04-22 DIAGNOSIS — M25561 Pain in right knee: Secondary | ICD-10-CM | POA: Diagnosis not present

## 2021-04-22 DIAGNOSIS — M25661 Stiffness of right knee, not elsewhere classified: Secondary | ICD-10-CM | POA: Diagnosis not present

## 2021-04-22 DIAGNOSIS — M6281 Muscle weakness (generalized): Secondary | ICD-10-CM | POA: Diagnosis not present

## 2021-04-24 DIAGNOSIS — M25661 Stiffness of right knee, not elsewhere classified: Secondary | ICD-10-CM | POA: Diagnosis not present

## 2021-04-24 DIAGNOSIS — M6281 Muscle weakness (generalized): Secondary | ICD-10-CM | POA: Diagnosis not present

## 2021-04-24 DIAGNOSIS — M25561 Pain in right knee: Secondary | ICD-10-CM | POA: Diagnosis not present

## 2021-04-29 DIAGNOSIS — M25661 Stiffness of right knee, not elsewhere classified: Secondary | ICD-10-CM | POA: Diagnosis not present

## 2021-04-29 DIAGNOSIS — M25561 Pain in right knee: Secondary | ICD-10-CM | POA: Diagnosis not present

## 2021-04-29 DIAGNOSIS — M6281 Muscle weakness (generalized): Secondary | ICD-10-CM | POA: Diagnosis not present

## 2021-04-30 DIAGNOSIS — M25561 Pain in right knee: Secondary | ICD-10-CM | POA: Diagnosis not present

## 2021-05-01 DIAGNOSIS — M25661 Stiffness of right knee, not elsewhere classified: Secondary | ICD-10-CM | POA: Diagnosis not present

## 2021-05-01 DIAGNOSIS — M25561 Pain in right knee: Secondary | ICD-10-CM | POA: Diagnosis not present

## 2021-05-01 DIAGNOSIS — M6281 Muscle weakness (generalized): Secondary | ICD-10-CM | POA: Diagnosis not present

## 2021-05-06 DIAGNOSIS — M25561 Pain in right knee: Secondary | ICD-10-CM | POA: Diagnosis not present

## 2021-05-06 DIAGNOSIS — M25661 Stiffness of right knee, not elsewhere classified: Secondary | ICD-10-CM | POA: Diagnosis not present

## 2021-05-06 DIAGNOSIS — M6281 Muscle weakness (generalized): Secondary | ICD-10-CM | POA: Diagnosis not present

## 2021-05-08 DIAGNOSIS — M6281 Muscle weakness (generalized): Secondary | ICD-10-CM | POA: Diagnosis not present

## 2021-05-08 DIAGNOSIS — M25661 Stiffness of right knee, not elsewhere classified: Secondary | ICD-10-CM | POA: Diagnosis not present

## 2021-05-08 DIAGNOSIS — M25561 Pain in right knee: Secondary | ICD-10-CM | POA: Diagnosis not present

## 2021-05-13 DIAGNOSIS — M25661 Stiffness of right knee, not elsewhere classified: Secondary | ICD-10-CM | POA: Diagnosis not present

## 2021-05-13 DIAGNOSIS — M25561 Pain in right knee: Secondary | ICD-10-CM | POA: Diagnosis not present

## 2021-05-13 DIAGNOSIS — M6281 Muscle weakness (generalized): Secondary | ICD-10-CM | POA: Diagnosis not present

## 2021-05-15 DIAGNOSIS — M6281 Muscle weakness (generalized): Secondary | ICD-10-CM | POA: Diagnosis not present

## 2021-05-15 DIAGNOSIS — M25661 Stiffness of right knee, not elsewhere classified: Secondary | ICD-10-CM | POA: Diagnosis not present

## 2021-05-15 DIAGNOSIS — M25561 Pain in right knee: Secondary | ICD-10-CM | POA: Diagnosis not present

## 2021-05-20 DIAGNOSIS — M25561 Pain in right knee: Secondary | ICD-10-CM | POA: Diagnosis not present

## 2021-05-20 DIAGNOSIS — M25661 Stiffness of right knee, not elsewhere classified: Secondary | ICD-10-CM | POA: Diagnosis not present

## 2021-05-20 DIAGNOSIS — Z961 Presence of intraocular lens: Secondary | ICD-10-CM | POA: Diagnosis not present

## 2021-05-20 DIAGNOSIS — H04123 Dry eye syndrome of bilateral lacrimal glands: Secondary | ICD-10-CM | POA: Diagnosis not present

## 2021-05-20 DIAGNOSIS — M6281 Muscle weakness (generalized): Secondary | ICD-10-CM | POA: Diagnosis not present

## 2021-05-27 DIAGNOSIS — M25661 Stiffness of right knee, not elsewhere classified: Secondary | ICD-10-CM | POA: Diagnosis not present

## 2021-05-27 DIAGNOSIS — M25561 Pain in right knee: Secondary | ICD-10-CM | POA: Diagnosis not present

## 2021-05-27 DIAGNOSIS — M6281 Muscle weakness (generalized): Secondary | ICD-10-CM | POA: Diagnosis not present

## 2021-05-29 DIAGNOSIS — M6281 Muscle weakness (generalized): Secondary | ICD-10-CM | POA: Diagnosis not present

## 2021-05-29 DIAGNOSIS — M25661 Stiffness of right knee, not elsewhere classified: Secondary | ICD-10-CM | POA: Diagnosis not present

## 2021-05-29 DIAGNOSIS — M25561 Pain in right knee: Secondary | ICD-10-CM | POA: Diagnosis not present

## 2021-06-03 DIAGNOSIS — M25561 Pain in right knee: Secondary | ICD-10-CM | POA: Diagnosis not present

## 2021-06-03 DIAGNOSIS — M6281 Muscle weakness (generalized): Secondary | ICD-10-CM | POA: Diagnosis not present

## 2021-06-03 DIAGNOSIS — M25661 Stiffness of right knee, not elsewhere classified: Secondary | ICD-10-CM | POA: Diagnosis not present

## 2021-06-05 DIAGNOSIS — M6281 Muscle weakness (generalized): Secondary | ICD-10-CM | POA: Diagnosis not present

## 2021-06-05 DIAGNOSIS — M25661 Stiffness of right knee, not elsewhere classified: Secondary | ICD-10-CM | POA: Diagnosis not present

## 2021-06-05 DIAGNOSIS — M25561 Pain in right knee: Secondary | ICD-10-CM | POA: Diagnosis not present

## 2021-06-10 DIAGNOSIS — M6281 Muscle weakness (generalized): Secondary | ICD-10-CM | POA: Diagnosis not present

## 2021-06-10 DIAGNOSIS — M25661 Stiffness of right knee, not elsewhere classified: Secondary | ICD-10-CM | POA: Diagnosis not present

## 2021-06-10 DIAGNOSIS — M25561 Pain in right knee: Secondary | ICD-10-CM | POA: Diagnosis not present

## 2021-07-30 DIAGNOSIS — R350 Frequency of micturition: Secondary | ICD-10-CM | POA: Diagnosis not present

## 2021-08-05 DIAGNOSIS — R102 Pelvic and perineal pain: Secondary | ICD-10-CM | POA: Diagnosis not present

## 2021-08-06 ENCOUNTER — Ambulatory Visit
Admission: RE | Admit: 2021-08-06 | Discharge: 2021-08-06 | Disposition: A | Discharge status: Home / Self Care | Payer: Medicare Other | Source: Ambulatory Visit | Attending: Obstetrics | Admitting: Obstetrics

## 2021-08-06 DIAGNOSIS — Z780 Asymptomatic menopausal state: Secondary | ICD-10-CM | POA: Diagnosis not present

## 2021-08-06 DIAGNOSIS — M8580 Other specified disorders of bone density and structure, unspecified site: Secondary | ICD-10-CM

## 2021-08-06 DIAGNOSIS — M85851 Other specified disorders of bone density and structure, right thigh: Secondary | ICD-10-CM | POA: Diagnosis not present

## 2021-08-16 DIAGNOSIS — M793 Panniculitis, unspecified: Secondary | ICD-10-CM | POA: Insufficient documentation

## 2021-08-18 DIAGNOSIS — M8580 Other specified disorders of bone density and structure, unspecified site: Secondary | ICD-10-CM | POA: Diagnosis not present

## 2021-08-21 DIAGNOSIS — R102 Pelvic and perineal pain: Secondary | ICD-10-CM | POA: Diagnosis not present

## 2021-08-21 DIAGNOSIS — R350 Frequency of micturition: Secondary | ICD-10-CM | POA: Diagnosis not present

## 2021-09-12 DIAGNOSIS — Z79621 Long term (current) use of calcineurin inhibitor: Secondary | ICD-10-CM | POA: Diagnosis not present

## 2021-09-12 DIAGNOSIS — L570 Actinic keratosis: Secondary | ICD-10-CM | POA: Diagnosis not present

## 2021-09-12 DIAGNOSIS — L821 Other seborrheic keratosis: Secondary | ICD-10-CM | POA: Diagnosis not present

## 2021-09-12 DIAGNOSIS — Z79899 Other long term (current) drug therapy: Secondary | ICD-10-CM | POA: Diagnosis not present

## 2021-09-12 DIAGNOSIS — L658 Other specified nonscarring hair loss: Secondary | ICD-10-CM | POA: Diagnosis not present

## 2021-09-12 DIAGNOSIS — Z79631 Long term (current) use of antimetabolite agent: Secondary | ICD-10-CM | POA: Diagnosis not present

## 2021-09-12 DIAGNOSIS — L661 Lichen planopilaris: Secondary | ICD-10-CM | POA: Diagnosis not present

## 2021-09-12 DIAGNOSIS — Z5181 Encounter for therapeutic drug level monitoring: Secondary | ICD-10-CM | POA: Diagnosis not present

## 2021-10-09 DIAGNOSIS — K862 Cyst of pancreas: Secondary | ICD-10-CM | POA: Diagnosis not present

## 2021-10-09 DIAGNOSIS — R3915 Urgency of urination: Secondary | ICD-10-CM | POA: Diagnosis not present

## 2021-10-09 DIAGNOSIS — J309 Allergic rhinitis, unspecified: Secondary | ICD-10-CM | POA: Diagnosis not present

## 2021-10-09 DIAGNOSIS — I679 Cerebrovascular disease, unspecified: Secondary | ICD-10-CM | POA: Diagnosis not present

## 2021-10-09 DIAGNOSIS — M899 Disorder of bone, unspecified: Secondary | ICD-10-CM | POA: Diagnosis not present

## 2021-10-09 DIAGNOSIS — Z0000 Encounter for general adult medical examination without abnormal findings: Secondary | ICD-10-CM | POA: Diagnosis not present

## 2021-10-09 DIAGNOSIS — E538 Deficiency of other specified B group vitamins: Secondary | ICD-10-CM | POA: Diagnosis not present

## 2021-10-09 DIAGNOSIS — Z1331 Encounter for screening for depression: Secondary | ICD-10-CM | POA: Diagnosis not present

## 2021-10-09 DIAGNOSIS — K5790 Diverticulosis of intestine, part unspecified, without perforation or abscess without bleeding: Secondary | ICD-10-CM | POA: Diagnosis not present

## 2021-10-09 DIAGNOSIS — Z0183 Encounter for blood typing: Secondary | ICD-10-CM | POA: Diagnosis not present

## 2021-10-09 DIAGNOSIS — E039 Hypothyroidism, unspecified: Secondary | ICD-10-CM | POA: Diagnosis not present

## 2021-10-09 DIAGNOSIS — J4520 Mild intermittent asthma, uncomplicated: Secondary | ICD-10-CM | POA: Diagnosis not present

## 2021-10-09 DIAGNOSIS — D7589 Other specified diseases of blood and blood-forming organs: Secondary | ICD-10-CM | POA: Diagnosis not present

## 2021-10-09 DIAGNOSIS — E669 Obesity, unspecified: Secondary | ICD-10-CM | POA: Diagnosis not present

## 2021-10-09 DIAGNOSIS — R3129 Other microscopic hematuria: Secondary | ICD-10-CM | POA: Diagnosis not present

## 2021-10-24 DIAGNOSIS — R609 Edema, unspecified: Secondary | ICD-10-CM | POA: Diagnosis not present

## 2021-10-24 DIAGNOSIS — L299 Pruritus, unspecified: Secondary | ICD-10-CM | POA: Diagnosis not present

## 2021-11-03 DIAGNOSIS — Z23 Encounter for immunization: Secondary | ICD-10-CM | POA: Diagnosis not present

## 2022-01-26 ENCOUNTER — Ambulatory Visit
Admission: RE | Admit: 2022-01-26 | Discharge: 2022-01-26 | Disposition: A | Discharge status: Home / Self Care | Payer: Medicare Other | Source: Ambulatory Visit | Attending: Internal Medicine | Admitting: Internal Medicine

## 2022-01-26 ENCOUNTER — Other Ambulatory Visit: Payer: Self-pay | Admitting: Internal Medicine

## 2022-01-26 DIAGNOSIS — M19011 Primary osteoarthritis, right shoulder: Secondary | ICD-10-CM | POA: Diagnosis not present

## 2022-01-26 DIAGNOSIS — M8920 Other disorders of bone development and growth, unspecified site: Secondary | ICD-10-CM

## 2022-01-30 ENCOUNTER — Encounter (HOSPITAL_COMMUNITY): Payer: Self-pay

## 2022-01-30 ENCOUNTER — Ambulatory Visit (HOSPITAL_COMMUNITY)
Admission: RE | Admit: 2022-01-30 | Discharge: 2022-01-30 | Disposition: A | Discharge status: Home / Self Care | Payer: Medicare Other | Source: Ambulatory Visit | Attending: Family Medicine | Admitting: Family Medicine

## 2022-01-30 VITALS — BP 135/80 | HR 67 | Temp 99.1°F | Resp 18

## 2022-01-30 DIAGNOSIS — M89319 Hypertrophy of bone, unspecified shoulder: Secondary | ICD-10-CM | POA: Diagnosis not present

## 2022-01-30 HISTORY — DX: Disorder of thyroid, unspecified: E07.9

## 2022-01-30 HISTORY — DX: Other intervertebral disc degeneration, lumbar region: M51.36

## 2022-01-30 HISTORY — DX: Unspecified osteoarthritis, unspecified site: M19.90

## 2022-01-30 HISTORY — DX: Lichen sclerosus et atrophicus: L90.0

## 2022-01-30 HISTORY — DX: Unspecified asthma, uncomplicated: J45.909

## 2022-01-30 HISTORY — DX: Lichen planopilaris, unspecified: L66.10

## 2022-01-30 HISTORY — DX: Lichen planopilaris: L66.1

## 2022-01-30 HISTORY — DX: Lichen planus, unspecified: L43.9

## 2022-01-30 HISTORY — DX: Other intervertebral disc degeneration, lumbar region without mention of lumbar back pain or lower extremity pain: M51.369

## 2022-01-30 NOTE — ED Triage Notes (Signed)
Pt states she noticed on Saturday she has a knot on the right side of her neck she doesn't know how long it has been there and there is no pain.

## 2022-01-30 NOTE — ED Provider Notes (Signed)
[place]    CSN: [id number] Arrival date & time: 01/30/22  1348      History   Chief Complaint Chief Complaint  Patient presents with   Cyst    HPI Melissa Craig is a 76 y.o. female.   She noted a lump at the base of the neck, at the right medial clavicle.  Has been there about 1 week.  No painful, has not really changed in the last week or so.  No fevers/chills noted.  No night sweats.  No sob. No n/v.  No other lumps noted.  She has allergies/runny nose, but no other uri symptoms noted.   She is in between pcps at the moment.  She did call her old pcp last week and had an xray.  This was normal, but she was unhappy with that answer and wants to know what this is.       Past Medical History:  Diagnosis Date   Arthritis    Asthma    Bulging lumbar disc    Lichen plano-pilaris    Lichen planus    Lichen sclerosus    Thyroid disease     There are no problems to display for this patient.   Past Surgical History:  Procedure Laterality Date   DILATION AND CURETTAGE OF UTERUS     SINUSOTOMY      OB History   No obstetric history on file.      Home Medications    Prior to Admission medications   Medication Sig Start Date End Date Taking? Authorizing Provider  albuterol (VENTOLIN HFA) 108 (90 Base) MCG/ACT inhaler Inhale into the lungs.   Yes [provider]  ASMANEX 60 METERED DOSES 220 MCG/INH inhaler  07/13/14  Yes [provider]  Biotin 5000 MCG CAPS Take by mouth.   Yes [provider]  budesonide (PULMICORT) 180 MCG/ACT inhaler Inhale into the lungs 2 (two) times daily.   Yes [provider]  calcium citrate-vitamin D (CITRACAL+D) 315-200 MG-UNIT per tablet Take 1 tablet by mouth 2 (two) times daily.   Yes [provider]  cetirizine (ZYRTEC) 10 MG tablet Take 10 mg by mouth daily.   Yes [provider]  cholecalciferol (VITAMIN D) 1000 UNITS tablet Take 1,000 Units by mouth daily.    Yes [provider]  estradiol (ESTRACE) 0.1 MG/GM vaginal cream SMARTSIG:1 Applicator Vaginal Once a Week   Yes [provider]  finasteride (PROSCAR) 5 MG tablet Take 5 mg by mouth daily.   Yes [provider]  folic acid (FOLVITE) 1 MG tablet  09/04/14  Yes [provider]  methotrexate (RHEUMATREX) 2.5 MG tablet  09/06/14  Yes [provider]  prednisoLONE acetate (PRED FORTE) 1 % ophthalmic suspension  08/28/14  Yes [provider]  PROLENSA 0.07 % SOLN  08/28/14  Yes [provider]  SYNTHROID 112 MCG tablet  07/20/14  Yes [provider]  tacrolimus (PROGRAF) 1 MG capsule daily as needed.   Yes [provider]  valACYclovir (VALTREX) 500 MG tablet  09/27/14  Yes [provider]    Family History History reviewed. No pertinent family history.  Social History Social History   Tobacco Use   Smoking status: Former   Tobacco comments:    Stopped in 1986  Vaping Use   Vaping Use: Never used  Substance Use Topics   Alcohol use: Yes    Alcohol/week: 7.0 standard drinks of alcohol    Types: 7 Glasses  of wine per week   Drug use: Never     Allergies   Gentamycin [gentamicin], Ceftin [cefuroxime axetil], Ciprocinonide [fluocinolone], Ampicillin, Other, Penicillins, Pollen extract, and Sulfa antibiotics   Review of Systems Review of Systems  Constitutional: Negative.   HENT: Negative.    Respiratory: Negative.    Cardiovascular: Negative.   Gastrointestinal: Negative.   Musculoskeletal: Negative.   Psychiatric/Behavioral: Negative.       Physical Exam Triage Vital Signs ED Triage Vitals  Enc Vitals Group     BP 01/30/22 1451 135/80     Pulse Rate 01/30/22 1451 67     Resp 01/30/22 1451 18     Temp 01/30/22 1451 99.1 F (37.3 C)     Temp Source 01/30/22 1451 Oral     SpO2 01/30/22 1451 98 %     Weight --      Height --      Head Circumference --      Peak Flow --      Pain Score  01/30/22 1443 0     Pain Loc --      Pain Edu? --      Excl. in [place]? --    No data found.  Updated Vital Signs BP 135/80 (BP Location: Right Arm)   Pulse 67   Temp 99.1 F (37.3 C) (Oral)   Resp 18   SpO2 98%   Visual Acuity Right Eye Distance:   Left Eye Distance:   Bilateral Distance:    Right Eye Near:   Left Eye Near:    Bilateral Near:     Physical Exam Constitutional:      Appearance: Normal appearance.  Cardiovascular:     Rate and Rhythm: Normal rate and regular rhythm.  Pulmonary:     Effort: Pulmonary effort is normal.     Breath sounds: Normal breath sounds.  Musculoskeletal:     Cervical back: Normal range of motion and neck supple. No rigidity or tenderness.     Comments: At the medial aspect of the clavicle there what feels like an enlargement/bony growth;  this area is nontender, and does not move with movement of the neck;  no LAD noted.   Lymphadenopathy:     Cervical: No cervical adenopathy.  Skin:    General: Skin is warm.  Neurological:     General: No focal deficit present.     Mental Status: She is alert.  Psychiatric:        Mood and Affect: Mood normal.      UC Treatments / Results  Labs (all labs ordered are listed, but only abnormal results are displayed) Labs Reviewed - No data to display  EKG   Radiology No results found.  Procedures Procedures (including critical care time)  Medications Ordered in UC Medications - No data to display  Initial Impression / Assessment and Plan / UC Course  I have reviewed the triage vital signs and the nursing notes.  Pertinent labs & imaging results that were available during my care of the patient were reviewed by me and considered in my medical decision making (see chart for details).  Patient was seen today for a bony protuberance a the clavicle.  No pain, no LAD, no concerning symptoms otherwise.  Xray was negative.  Discussed that in the UC I have no other modality to work this up  further.  If she is truly concerned I would recommend she follow up with her pcp to discuss additional imaging.  Final Clinical Impressions(s) / UC Diagnoses   Final diagnoses:  Clavicle enlargement     Discharge Instructions      You were seen today for enlargement at the clavicle.  Since you have had an xray already, there is little else I can do today.  I do recommend you follow up with your primary care provider to discuss further testing at this time.     ED Prescriptions   None    PDMP not reviewed this encounter.   Rondel Oh, MD 01/30/22 [phone]

## 2022-01-30 NOTE — Discharge Instructions (Addendum)
You were seen today for enlargement at the clavicle.  Since you have had an xray already, there is little else I can do today.  I do recommend you follow up with your primary care provider to discuss further testing at this time.

## 2022-02-06 DIAGNOSIS — R221 Localized swelling, mass and lump, neck: Secondary | ICD-10-CM | POA: Diagnosis not present

## 2022-02-06 DIAGNOSIS — R6889 Other general symptoms and signs: Secondary | ICD-10-CM | POA: Diagnosis not present

## 2022-03-12 DIAGNOSIS — Z01411 Encounter for gynecological examination (general) (routine) with abnormal findings: Secondary | ICD-10-CM | POA: Diagnosis not present

## 2022-03-12 DIAGNOSIS — Z01419 Encounter for gynecological examination (general) (routine) without abnormal findings: Secondary | ICD-10-CM | POA: Diagnosis not present

## 2022-03-12 DIAGNOSIS — Z779 Other contact with and (suspected) exposures hazardous to health: Secondary | ICD-10-CM | POA: Diagnosis not present

## 2022-03-12 DIAGNOSIS — A609 Anogenital herpesviral infection, unspecified: Secondary | ICD-10-CM | POA: Diagnosis not present

## 2022-03-12 DIAGNOSIS — Z1231 Encounter for screening mammogram for malignant neoplasm of breast: Secondary | ICD-10-CM | POA: Diagnosis not present

## 2022-03-12 DIAGNOSIS — Z6837 Body mass index (BMI) 37.0-37.9, adult: Secondary | ICD-10-CM | POA: Diagnosis not present

## 2022-03-12 DIAGNOSIS — N952 Postmenopausal atrophic vaginitis: Secondary | ICD-10-CM | POA: Diagnosis not present

## 2022-03-12 DIAGNOSIS — L900 Lichen sclerosus et atrophicus: Secondary | ICD-10-CM | POA: Diagnosis not present

## 2022-03-12 DIAGNOSIS — A6004 Herpesviral vulvovaginitis: Secondary | ICD-10-CM | POA: Diagnosis not present

## 2022-03-12 DIAGNOSIS — M8580 Other specified disorders of bone density and structure, unspecified site: Secondary | ICD-10-CM | POA: Diagnosis not present

## 2022-03-12 DIAGNOSIS — Z124 Encounter for screening for malignant neoplasm of cervix: Secondary | ICD-10-CM | POA: Diagnosis not present

## 2022-03-26 ENCOUNTER — Encounter: Payer: Self-pay | Admitting: Internal Medicine

## 2022-03-26 ENCOUNTER — Ambulatory Visit (INDEPENDENT_AMBULATORY_CARE_PROVIDER_SITE_OTHER): Payer: Medicare Other | Admitting: Internal Medicine

## 2022-03-26 VITALS — BP 120/60 | HR 79 | Temp 97.6°F | Ht 61.0 in | Wt 199.0 lb

## 2022-03-26 DIAGNOSIS — L658 Other specified nonscarring hair loss: Secondary | ICD-10-CM | POA: Diagnosis not present

## 2022-03-26 DIAGNOSIS — H906 Mixed conductive and sensorineural hearing loss, bilateral: Secondary | ICD-10-CM | POA: Diagnosis not present

## 2022-03-26 DIAGNOSIS — M89319 Hypertrophy of bone, unspecified shoulder: Secondary | ICD-10-CM

## 2022-03-26 DIAGNOSIS — J324 Chronic pansinusitis: Secondary | ICD-10-CM | POA: Diagnosis not present

## 2022-03-26 DIAGNOSIS — E039 Hypothyroidism, unspecified: Secondary | ICD-10-CM | POA: Diagnosis not present

## 2022-03-26 NOTE — Progress Notes (Signed)
   Subjective:   Patient ID: Melissa Craig, female    DOB: January 12, 1946, 77 y.o.   MRN: [id number]  HPI The patient is a new 77 YO female coming in for ongoing care.  PMH, [facility], social history reviewed and updated  Review of Systems  Constitutional: Negative.   HENT: Negative.    Eyes: Negative.   Respiratory:  Negative for cough, chest tightness and shortness of breath.   Cardiovascular:  Negative for chest pain, palpitations and leg swelling.  Gastrointestinal:  Negative for abdominal distention, abdominal pain, constipation, diarrhea, nausea and vomiting.  Musculoskeletal: Negative.   Skin: Negative.   Neurological: Negative.   Psychiatric/Behavioral: Negative.      Objective:  Physical Exam Constitutional:      Appearance: She is well-developed.  HENT:     Head: Normocephalic and atraumatic.  Cardiovascular:     Rate and Rhythm: Normal rate and regular rhythm.     Comments: Prominence right sternoclavicular junction Pulmonary:     Effort: Pulmonary effort is normal. No respiratory distress.     Breath sounds: Normal breath sounds. No wheezing or rales.  Abdominal:     General: Bowel sounds are normal. There is no distension.     Palpations: Abdomen is soft.     Tenderness: There is no abdominal tenderness. There is no rebound.  Musculoskeletal:     Cervical back: Normal range of motion.  Skin:    General: Skin is warm and dry.  Neurological:     Mental Status: She is alert and oriented to person, place, and time.     Coordination: Coordination normal.     Vitals:   03/26/22 0946  BP: 120/60  Pulse: 79  Temp: 97.6 F (36.4 C)  TempSrc: Oral  SpO2: 98%  Weight: 199 lb (90.3 kg)  Height: '5\' 1"'$  (1.549 m)    Assessment & Plan:

## 2022-03-26 NOTE — Patient Instructions (Addendum)
We will see you back in 6months

## 2022-03-27 DIAGNOSIS — M89319 Hypertrophy of bone, unspecified shoulder: Secondary | ICD-10-CM | POA: Insufficient documentation

## 2022-03-27 DIAGNOSIS — E039 Hypothyroidism, unspecified: Secondary | ICD-10-CM | POA: Insufficient documentation

## 2022-03-27 NOTE — Assessment & Plan Note (Signed)
Wishes to pursue hearing aids, referral to audiology done today.

## 2022-03-27 NOTE — Assessment & Plan Note (Signed)
Seeing derm every 6 month and gets labs with them. Using prograf topical and methotrexate and folic acid to help and proscar. Overall stable.

## 2022-03-27 NOTE — Assessment & Plan Note (Signed)
Suspect arthritis mediated. X-ray without findings and non-tender. There is prominence of the sternoclavicular region right sided. Reassurance given.

## 2022-03-27 NOTE — Assessment & Plan Note (Signed)
Prior procedure and takes zyrtec and nasonex daily.

## 2022-03-27 NOTE — Assessment & Plan Note (Signed)
Taking synthroid 112 mcg daily and no symptoms to suggest over or under replacement currently. Check labs in August.

## 2022-04-07 ENCOUNTER — Ambulatory Visit: Payer: Medicare Other | Attending: Audiologist | Admitting: Audiologist

## 2022-04-07 DIAGNOSIS — H903 Sensorineural hearing loss, bilateral: Secondary | ICD-10-CM | POA: Diagnosis present

## 2022-04-07 NOTE — Procedures (Signed)
  Outpatient Audiology and [place], [place]  [address] [phone]  AUDIOLOGICAL  EVALUATION  NAME: Melissa Craig     DOB:   1945-05-19      MRN: [id number]                                                                                     DATE: 04/07/2022     REFERENT: Hoyt Koch, MD STATUS: Outpatient DIAGNOSIS: Sensorineural Hearing Loss     History: Melissa Craig was seen for an audiological evaluation.  Melissa Craig is receiving a hearing evaluation due to concerns for increased difficulty hearing. Melissa Craig has difficulty hearing in the movies, on the phone, when people are at a distance and when they mumble. This difficulty began gradually. Melissa Craig had a hearing test with [facility] a while ago and tried hearing aids, she was not a fan. No pain or pressure reported in either ear. Tinnitus present at a high pitch in both ears.  Medical history negative for a health condition which is a risk factor for hearing loss. No other relevant case history reported.   Evaluation:  Otoscopy showed a clear view of the tympanic membranes, bilaterally Tympanometry results were consistent with normal middle ear function, bilaterally   Audiometric testing was completed using conventional audiometry with insert and supraural transducer. Speech Recognition Thresholds were  45dB in the right ear and 45dB in the left ear. Word Recognition was performed 40dB SL, scored 88% in the right ear and 96% in the left ear. 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   Results:  The test results were reviewed with Melissa Craig. Melissa Craig has a sloping sensorineural hearing loss in each ear. This is a typical pattern of loss due to age related change. She cannot hear high pitched consonants. Hearing aids are highly recommended. She is ready to try hearing aids again.   Recommendations: Amplification is necessary for  both ears. Hearing aids can be purchased from a variety of locations. See provided list for locations in the [place] area.    41 minutes spent testing and counseling on results.   Alfonse Alpers  Audiologist, Au.D., CCC-A 04/07/2022  10:47 AM  Cc: Hoyt Koch, MD

## 2022-04-22 ENCOUNTER — Encounter: Payer: Self-pay | Admitting: Internal Medicine

## 2022-04-23 MED ORDER — SYNTHROID 112 MCG PO TABS: 112.0000 ug | ORAL_TABLET | Freq: Every day | ORAL | 3 refills | Status: DC

## 2022-04-27 DIAGNOSIS — Z23 Encounter for immunization: Secondary | ICD-10-CM | POA: Diagnosis not present

## 2022-05-05 DIAGNOSIS — L661 Lichen planopilaris: Secondary | ICD-10-CM | POA: Diagnosis not present

## 2022-05-05 DIAGNOSIS — Z5181 Encounter for therapeutic drug level monitoring: Secondary | ICD-10-CM | POA: Diagnosis not present

## 2022-08-19 ENCOUNTER — Ambulatory Visit (INDEPENDENT_AMBULATORY_CARE_PROVIDER_SITE_OTHER): Payer: Medicare Other

## 2022-08-19 DIAGNOSIS — H04123 Dry eye syndrome of bilateral lacrimal glands: Secondary | ICD-10-CM | POA: Diagnosis not present

## 2022-08-19 DIAGNOSIS — Z0000 Encounter for general adult medical examination without abnormal findings: Secondary | ICD-10-CM | POA: Diagnosis not present

## 2022-08-19 DIAGNOSIS — H43813 Vitreous degeneration, bilateral: Secondary | ICD-10-CM | POA: Diagnosis not present

## 2022-08-19 DIAGNOSIS — H35362 Drusen (degenerative) of macula, left eye: Secondary | ICD-10-CM | POA: Diagnosis not present

## 2022-08-19 DIAGNOSIS — Z961 Presence of intraocular lens: Secondary | ICD-10-CM | POA: Diagnosis not present

## 2022-08-19 NOTE — Patient Instructions (Signed)

## 2022-08-19 NOTE — Progress Notes (Signed)
Subjective:   Melissa Craig is a 77 y.o. female who presents for Medicare Annual (Subsequent) preventive examination.  Visit Complete: Virtual  I connected with  Melissa Craig on 08/19/22 by a audio enabled telemedicine application and verified that I am speaking with the correct person using two identifiers.  Patient Location: Home  Provider Location: Home Office  I discussed the limitations of evaluation and management by telemedicine. The patient expressed understanding and agreed to proceed.  Patient Medicare AWV questionnaire was completed by the patient on 08/19/2022; I have confirmed that all information answered by patient is correct and no changes since this date.   Cardiac Risk Factors include: advanced age (>37men, >74 women)     Objective:    Today's Vitals   There is no height or weight on file to calculate BMI.     08/19/2022   11:28 AM 10/02/2014   10:40 AM  Advanced Directives  Does Patient Have a Medical Advance Directive? Yes No  Type of Estate agent of [place];Living will   Does patient want to make changes to medical advance directive? No - Patient declined   Copy of Healthcare Power of Attorney in Chart? Yes - validated most recent copy scanned in chart (See row information)   Would patient like information on creating a medical advance directive?  No - patient declined information    Current Medications (verified) Outpatient Encounter Medications as of 08/19/2022  Medication Sig   albuterol (VENTOLIN HFA) 108 (90 Base) MCG/ACT inhaler Inhale into the lungs.   budesonide (PULMICORT) 180 MCG/ACT inhaler Inhale into the lungs 2 (two) times daily.   calcium citrate-vitamin D (CITRACAL+D) 315-200 MG-UNIT per tablet Take 1 tablet by mouth 2 (two) times daily.   cetirizine (ZYRTEC) 10 MG tablet Take 10 mg by mouth daily.   cholecalciferol (VITAMIN D) 1000 UNITS tablet Take 1,000 Units by mouth daily.   clobetasol cream (TEMOVATE) 0.05  % SMARTSIG:Sparingly Topical Twice a Week   EPINEPHrine (EPIPEN JR) 0.15 MG/0.3ML injection Inject into the muscle.   estradiol (ESTRACE) 0.1 MG/GM vaginal cream SMARTSIG:1 Applicator Vaginal Once a Week   finasteride (PROSCAR) 5 MG tablet Take 5 mg by mouth daily.   folic acid (FOLVITE) 1 MG tablet    methotrexate (RHEUMATREX) 2.5 MG tablet    mometasone (NASONEX) 50 MCG/ACT nasal spray Place into the nose.   nystatin cream (MYCOSTATIN) Apply 2 times a day to affected areas   SYNTHROID 112 MCG tablet Take 1 tablet (112 mcg total) by mouth daily before breakfast.   tacrolimus (PROGRAF) 1 MG capsule daily as needed.   valACYclovir (VALTREX) 500 MG tablet    No facility-administered encounter medications on file as of 08/19/2022.    Allergies (verified) Gentamycin [gentamicin], Cat hair extract, Ceftin [cefuroxime axetil], Ciprocinonide [fluocinolone], Ampicillin, Ciprofloxacin, Other, Penicillins, Pollen extract, Sulfa antibiotics, and Sulfasalazine   History: Past Medical History:  Diagnosis Date   Arthritis    Asthma    Bulging lumbar disc    Lichen plano-pilaris    Lichen planus    Lichen sclerosus    Thyroid disease    Past Surgical History:  Procedure Laterality Date   DILATION AND CURETTAGE OF UTERUS     SINUSOTOMY     History reviewed. No pertinent family history. Social History   Socioeconomic History   Marital status: Divorced    Spouse name: Not on file   Number of children: Not on file   Years of education: Not on file  Highest education level: Not on file  Occupational History   Not on file  Tobacco Use   Smoking status: Former   Smokeless tobacco: Not on file   Tobacco comments:    Stopped in 1986  Vaping Use   Vaping Use: Never used  Substance and Sexual Activity   Alcohol use: Yes    Alcohol/week: 7.0 standard drinks of alcohol    Types: 7 Glasses of wine per week   Drug use: Never   Sexual activity: Not on file  Other Topics Concern   Not on  file  Social History Narrative   Not on file   Social Determinants of Health   Financial Resource Strain: Low Risk  (08/18/2022)   Overall Financial Resource Strain (CARDIA)    Difficulty of Paying Living Expenses: Not hard at all  Food Insecurity: No Food Insecurity (08/18/2022)   Hunger Vital Sign    Worried About Running Out of Food in the Last Year: Never true    Ran Out of Food in the Last Year: Never true  Transportation Needs: No Transportation Needs (08/18/2022)   PRAPARE - Administrator, Civil Service (Medical): No    Lack of Transportation (Non-Medical): No  Physical Activity: Sufficiently Active (08/18/2022)   Exercise Vital Sign    Days of Exercise per Week: 4 days    Minutes of Exercise per Session: 50 min  Stress: No Stress Concern Present (08/18/2022)   [employer] of Occupational Health - Occupational Stress Questionnaire    Feeling of Stress : Only a little  Social Connections: Unknown (08/18/2022)   Social Connection and Isolation Panel [NHANES]    Frequency of Communication with Friends and Family: More than three times a week    Frequency of Social Gatherings with Friends and Family: More than three times a week    Attends Religious Services: Not on Marketing executive or Organizations: Yes    Attends Banker Meetings: 1 to 4 times per year    Marital Status: Divorced    Tobacco Counseling Counseling given: Not Answered Tobacco comments: Stopped in 1986   Clinical Intake:  Pre-visit preparation completed: Yes  Pain : No/denies pain     Nutritional Risks: Other (Comment) Diabetes: No  How often do you need to have someone help you when you read instructions, pamphlets, or other written materials from your doctor or pharmacy?: 1 - Never What is the last grade level you completed in school?: bachelors degree  Interpreter Needed?: No      Activities of Daily Living    08/19/2022   11:36 AM 08/18/2022   12:55 PM   In your present state of health, do you have any difficulty performing the following activities:  Hearing? 0 1  Vision? 0 0  Difficulty concentrating or making decisions? 0 0  Walking or climbing stairs? 0 0  Dressing or bathing? 0 0  Doing errands, shopping? 0 0  Preparing Food and eating ? N N  Using the Toilet? N N  In the past six months, have you accidently leaked urine? N N  Do you have problems with loss of bowel control? N N  Managing your Medications? N N  Managing your Finances? N N  Housekeeping or managing your Housekeeping? N N    Patient Care Team: Myrlene Broker, MD as PCP - General (Internal Medicine)  Indicate any recent Medical Services you may have received from other than [facility] providers  in the past year (date may be approximate).     Assessment:   This is a routine wellness examination for Melissa Craig.  Hearing/Vision screen No results found.  Dietary issues and exercise activities discussed:     Goals Addressed   None   Depression Screen    08/19/2022   11:34 AM  PHQ 2/9 Scores  PHQ - 2 Score 0  PHQ- 9 Score 0    Fall Risk    08/19/2022   11:34 AM 08/18/2022   12:55 PM 10/02/2014   10:00 AM  Fall Risk   Falls in the past year? 0 0 No  Number falls in past yr: 0    Injury with Fall? 0    Risk for fall due to : History of fall(s)    Follow up Falls prevention discussed      MEDICARE RISK AT HOME:  Medicare Risk at Home - 08/19/22 1137     Any stairs in or around the home? Yes    If so, are there any without handrails? No    Home free of loose throw rugs in walkways, pet beds, electrical cords, etc? Yes    Adequate lighting in your home to reduce risk of falls? Yes    Life alert? No    Use of a cane, walker or w/c? No    Grab bars in the bathroom? No    Shower chair or bench in shower? No    Elevated toilet seat or a handicapped toilet? No             TIMED UP AND GO:  Was the test performed?  No    Cognitive Function:         08/19/2022   11:37 AM  6CIT Screen  What Year? 0 points  What month? 0 points  What time? 0 points  Count back from 20 0 points  Months in reverse 0 points  Repeat phrase 0 points  Total Score 0 points    Immunizations Immunization History  Administered Date(s) Administered   Influenza, High Dose Seasonal PF 11/12/2021   PFIZER(Purple Top)SARS-COV-2 Vaccination 03/23/2019, 04/17/2019    TDAP status: Due, Education has been provided regarding the importance of this vaccine. Advised may receive this vaccine at local pharmacy or Health Dept. Aware to provide a copy of the vaccination record if obtained from local pharmacy or Health Dept. Verbalized acceptance and understanding.  Flu Vaccine status: Up to date  Pneumococcal vaccine status: Up to date  Covid-19 vaccine status: Completed vaccines  Qualifies for Shingles Vaccine? Yes   Zostavax completed Yes   Shingrix Completed?: Yes  Screening Tests Health Maintenance  Topic Date Due   Pneumonia Vaccine 2+ Years old (1 of 2 - PCV) Never done   Hepatitis C Screening  Never done   DTaP/Tdap/Td (1 - Tdap) Never done   Zoster Vaccines- Shingrix (1 of 2) Never done   COVID-19 Vaccine (3 - Pfizer risk series) 05/15/2019   INFLUENZA VACCINE  09/17/2022   Medicare Annual Wellness (AWV)  08/19/2023   DEXA SCAN  Completed   HPV VACCINES  Aged Out   Colonoscopy  Discontinued    Health Maintenance  Health Maintenance Due  Topic Date Due   Pneumonia Vaccine 41+ Years old (1 of 2 - PCV) Never done   Hepatitis C Screening  Never done   DTaP/Tdap/Td (1 - Tdap) Never done   Zoster Vaccines- Shingrix (1 of 2) Never done   COVID-19 Vaccine (3 -  Pfizer risk series) 05/15/2019    Colorectal cancer screening: No longer required.   Mammogram status: Completed 09/2022. Repeat every year  Bone Density status: Completed  . Results reflect: Bone density results: NORMAL. Repeat every 2 years.  Lung Cancer Screening: (Low Dose CT  Chest recommended if Age 61-80 years, 20 pack-year currently smoking OR have quit w/in 15years.) does not qualify.   Lung Cancer Screening Referral: na  Additional Screening:  Hepatitis C Screening: does qualify; Completed   Vision Screening: Recommended annual ophthalmology exams for early detection of glaucoma and other disorders of the eye. Is the patient up to date with their annual eye exam?  Yes  Who is the provider or what is the name of the office in which the patient attends annual eye exams? [facility] If pt is not established with a provider, would they like to be referred to a provider to establish care?  Estab;ished  .   Dental Screening: Recommended annual dental exams for proper oral hygiene  Diabetic Foot Exam:   Community Resource Referral / Chronic Care Management: CRR required this visit?  No   CCM required this visit?  No     Plan:     I have personally reviewed and noted the following in the patient's chart:   Medical and social history Use of alcohol, tobacco or illicit drugs  Current medications and supplements including opioid prescriptions. Patient is not currently taking opioid prescriptions. Functional ability and status Nutritional status Physical activity Advanced directives List of other physicians Hospitalizations, surgeries, and ER visits in previous 12 months Vitals Screenings to include cognitive, depression, and falls Referrals and appointments  In addition, I have reviewed and discussed with patient certain preventive protocols, quality metrics, and best practice recommendations. A written personalized care plan for preventive services as well as general preventive health recommendations were provided to patient.     Delana Meyer   08/19/2022   After Visit Summary: (MyChart) Due to this being a telephonic visit, the after visit summary with patients personalized plan was offered to patient via MyChart   Nurse Notes: none

## 2022-09-02 NOTE — Progress Notes (Signed)
Subjective:   Melissa Craig is a 77 y.o. female who presents for Medicare Annual (Subsequent) preventive examination.  Visit Complete: Virtual  I connected with  Lynann Bologna on 08/19/2022 by a audio enabled telemedicine application and verified that I am speaking with the correct person using two identifiers.  Patient Location: Home  Provider Location: Home Office  I discussed the limitations of evaluation and management by telemedicine. The patient expressed understanding and agreed to proceed.  Patient Medicare AWV questionnaire was completed by the patient on 08/19/2022; I have confirmed that all information answered by patient is correct and no changes since this date.   Cardiac Risk Factors include: advanced age (>76men, >38 women)     Objective:    Today's Vitals   There is no height or weight on file to calculate BMI.     08/19/2022   11:28 AM 10/02/2014   10:40 AM  Advanced Directives  Does Patient Have a Medical Advance Directive? Yes No  Type of Estate agent of [place];Living will   Does patient want to make changes to medical advance directive? No - Patient declined   Copy of Healthcare Power of Attorney in Chart? Yes - validated most recent copy scanned in chart (See row information)   Would patient like information on creating a medical advance directive?  No - patient declined information    Current Medications (verified) Outpatient Encounter Medications as of 08/19/2022  Medication Sig   albuterol (VENTOLIN HFA) 108 (90 Base) MCG/ACT inhaler Inhale into the lungs.   budesonide (PULMICORT) 180 MCG/ACT inhaler Inhale into the lungs 2 (two) times daily.   calcium citrate-vitamin D (CITRACAL+D) 315-200 MG-UNIT per tablet Take 1 tablet by mouth 2 (two) times daily.   cetirizine (ZYRTEC) 10 MG tablet Take 10 mg by mouth daily.   cholecalciferol (VITAMIN D) 1000 UNITS tablet Take 1,000 Units by mouth daily.   clobetasol cream (TEMOVATE)  0.05 % SMARTSIG:Sparingly Topical Twice a Week   EPINEPHrine (EPIPEN JR) 0.15 MG/0.3ML injection Inject into the muscle.   estradiol (ESTRACE) 0.1 MG/GM vaginal cream SMARTSIG:1 Applicator Vaginal Once a Week   finasteride (PROSCAR) 5 MG tablet Take 5 mg by mouth daily.   folic acid (FOLVITE) 1 MG tablet    methotrexate (RHEUMATREX) 2.5 MG tablet    mometasone (NASONEX) 50 MCG/ACT nasal spray Place into the nose.   nystatin cream (MYCOSTATIN) Apply 2 times a day to affected areas   SYNTHROID 112 MCG tablet Take 1 tablet (112 mcg total) by mouth daily before breakfast.   tacrolimus (PROGRAF) 1 MG capsule daily as needed.   valACYclovir (VALTREX) 500 MG tablet    No facility-administered encounter medications on file as of 08/19/2022.    Allergies (verified) Gentamycin [gentamicin], Cat hair extract, Ceftin [cefuroxime axetil], Ciprocinonide [fluocinolone], Ampicillin, Ciprofloxacin, Other, Penicillins, Pollen extract, Sulfa antibiotics, and Sulfasalazine   History: Past Medical History:  Diagnosis Date   Arthritis    Asthma    Bulging lumbar disc    Lichen plano-pilaris    Lichen planus    Lichen sclerosus    Thyroid disease    Past Surgical History:  Procedure Laterality Date   DILATION AND CURETTAGE OF UTERUS     SINUSOTOMY     History reviewed. No pertinent family history. Social History   Socioeconomic History   Marital status: Divorced    Spouse name: Not on file   Number of children: Not on file   Years of education: Not on file  Highest education level: Not on file  Occupational History   Not on file  Tobacco Use   Smoking status: Former   Smokeless tobacco: Not on file   Tobacco comments:    Stopped in 1986  Vaping Use   Vaping status: Never Used  Substance and Sexual Activity   Alcohol use: Yes    Alcohol/week: 7.0 standard drinks of alcohol    Types: 7 Glasses of wine per week   Drug use: Never   Sexual activity: Not on file  Other Topics Concern    Not on file  Social History Narrative   Not on file   Social Determinants of Health   Financial Resource Strain: Low Risk  (08/18/2022)   Overall Financial Resource Strain (CARDIA)    Difficulty of Paying Living Expenses: Not hard at all  Food Insecurity: No Food Insecurity (08/18/2022)   Hunger Vital Sign    Worried About Running Out of Food in the Last Year: Never true    Ran Out of Food in the Last Year: Never true  Transportation Needs: No Transportation Needs (08/18/2022)   PRAPARE - Administrator, Civil Service (Medical): No    Lack of Transportation (Non-Medical): No  Physical Activity: Sufficiently Active (08/18/2022)   Exercise Vital Sign    Days of Exercise per Week: 4 days    Minutes of Exercise per Session: 50 min  Stress: No Stress Concern Present (08/18/2022)   [employer] of Occupational Health - Occupational Stress Questionnaire    Feeling of Stress : Only a little  Social Connections: Unknown (08/18/2022)   Social Connection and Isolation Panel [NHANES]    Frequency of Communication with Friends and Family: More than three times a week    Frequency of Social Gatherings with Friends and Family: More than three times a week    Attends Religious Services: Not on Marketing executive or Organizations: Yes    Attends Banker Meetings: 1 to 4 times per year    Marital Status: Divorced    Tobacco Counseling Counseling given: Not Answered Tobacco comments: Stopped in 1986   Clinical Intake:  Pre-visit preparation completed: Yes  Pain : No/denies pain     Nutritional Risks: Other (Comment) Diabetes: No  How often do you need to have someone help you when you read instructions, pamphlets, or other written materials from your doctor or pharmacy?: 1 - Never What is the last grade level you completed in school?: bachelors degree  Interpreter Needed?: No      Activities of Daily Living    08/19/2022   11:36 AM 08/18/2022    12:55 PM  In your present state of health, do you have any difficulty performing the following activities:  Hearing? 0 1  Vision? 0 0  Difficulty concentrating or making decisions? 0 0  Walking or climbing stairs? 0 0  Dressing or bathing? 0 0  Doing errands, shopping? 0 0  Preparing Food and eating ? N N  Using the Toilet? N N  In the past six months, have you accidently leaked urine? N N  Do you have problems with loss of bowel control? N N  Managing your Medications? N N  Managing your Finances? N N  Housekeeping or managing your Housekeeping? N N    Patient Care Team: Myrlene Broker, MD as PCP - General (Internal Medicine)  Indicate any recent Medical Services you may have received from other than [facility] providers  in the past year (date may be approximate).     Assessment:   This is a routine wellness examination for Melissa Craig.  Hearing/Vision screen No results found.  Dietary issues and exercise activities discussed:     Goals Addressed   None   Depression Screen    08/19/2022   11:34 AM  PHQ 2/9 Scores  PHQ - 2 Score 0  PHQ- 9 Score 0    Fall Risk    08/19/2022   11:34 AM 08/18/2022   12:55 PM 10/02/2014   10:00 AM  Fall Risk   Falls in the past year? 0 0 No  Number falls in past yr: 0    Injury with Fall? 0    Risk for fall due to : History of fall(s)    Follow up Falls prevention discussed      MEDICARE RISK AT HOME:    TIMED UP AND GO:  Was the test performed?  No    Cognitive Function:        08/19/2022   11:37 AM  6CIT Screen  What Year? 0 points  What month? 0 points  What time? 0 points  Count back from 20 0 points  Months in reverse 0 points  Repeat phrase 0 points  Total Score 0 points    Immunizations Immunization History  Administered Date(s) Administered   Influenza, High Dose Seasonal PF 11/12/2021   PFIZER(Purple Top)SARS-COV-2 Vaccination 03/23/2019, 04/17/2019    TDAP status: Due, Education has been provided  regarding the importance of this vaccine. Advised may receive this vaccine at local pharmacy or Health Dept. Aware to provide a copy of the vaccination record if obtained from local pharmacy or Health Dept. Verbalized acceptance and understanding.  Flu Vaccine status: Up to date  Pneumococcal vaccine status: Up to date  Covid-19 vaccine status: Completed vaccines  Qualifies for Shingles Vaccine? Yes   Zostavax completed Yes   Shingrix Completed?: Yes  Screening Tests Health Maintenance  Topic Date Due   Pneumonia Vaccine 1+ Years old (1 of 2 - PCV) Never done   Hepatitis C Screening  Never done   DTaP/Tdap/Td (1 - Tdap) Never done   Zoster Vaccines- Shingrix (1 of 2) Never done   COVID-19 Vaccine (3 - Pfizer risk series) 05/15/2019   INFLUENZA VACCINE  09/17/2022   Medicare Annual Wellness (AWV)  08/19/2023   DEXA SCAN  Completed   HPV VACCINES  Aged Out   Colonoscopy  Discontinued    Health Maintenance  Health Maintenance Due  Topic Date Due   Pneumonia Vaccine 24+ Years old (1 of 2 - PCV) Never done   Hepatitis C Screening  Never done   DTaP/Tdap/Td (1 - Tdap) Never done   Zoster Vaccines- Shingrix (1 of 2) Never done   COVID-19 Vaccine (3 - Pfizer risk series) 05/15/2019    Colorectal cancer screening: No longer required.   Mammogram status: Completed 09/2022. Repeat every year  Bone Density status: Completed  . Results reflect: Bone density results: NORMAL. Repeat every 2 years.  Lung Cancer Screening: (Low Dose CT Chest recommended if Age 58-80 years, 20 pack-year currently smoking OR have quit w/in 15years.) does not qualify.   Lung Cancer Screening Referral: na  Additional Screening:  Hepatitis C Screening: does qualify; Completed   Vision Screening: Recommended annual ophthalmology exams for early detection of glaucoma and other disorders of the eye. Is the patient up to date with their annual eye exam?  Yes  Who is the  provider or what is the name of  the office in which the patient attends annual eye exams? [facility] If pt is not established with a provider, would they like to be referred to a provider to establish care?  Estab;ished  .   Dental Screening: Recommended annual dental exams for proper oral hygiene  Diabetic Foot Exam:   Community Resource Referral / Chronic Care Management: CRR required this visit?  No   CCM required this visit?  No     Plan:     I have personally reviewed and noted the following in the patient's chart:   Medical and social history Use of alcohol, tobacco or illicit drugs  Current medications and supplements including opioid prescriptions. Patient is not currently taking opioid prescriptions. Functional ability and status Nutritional status Physical activity Advanced directives List of other physicians Hospitalizations, surgeries, and ER visits in previous 12 months Vitals Screenings to include cognitive, depression, and falls Referrals and appointments  In addition, I have reviewed and discussed with patient certain preventive protocols, quality metrics, and best practice recommendations. A written personalized care plan for preventive services as well as general preventive health recommendations were provided to patient.     Delana Meyer   08/19/2022   After Visit Summary: (MyChart) Due to this being a telephonic visit, the after visit summary with patients personalized plan was offered to patient via MyChart   Nurse Notes: none

## 2022-09-14 ENCOUNTER — Telehealth (INDEPENDENT_AMBULATORY_CARE_PROVIDER_SITE_OTHER): Payer: Medicare Other | Admitting: Internal Medicine

## 2022-09-14 ENCOUNTER — Encounter: Payer: Self-pay | Admitting: Internal Medicine

## 2022-09-14 DIAGNOSIS — U071 COVID-19: Secondary | ICD-10-CM | POA: Diagnosis not present

## 2022-09-14 MED ORDER — NIRMATRELVIR/RITONAVIR (PAXLOVID)TABLET: 3.0000 | ORAL_TABLET | Freq: Two times a day (BID) | ORAL | 0 refills | Status: AC

## 2022-09-14 NOTE — Progress Notes (Signed)
Virtual Visit via Video Note  I connected with Melissa Craig on 09/14/22 at 11:00 AM EDT by a video enabled telemedicine application and verified that I am speaking with the correct person using two identifiers.  The patient and the provider were at separate locations throughout the entire encounter. Patient location: home, Provider location: work   I discussed the limitations of evaluation and management by telemedicine and the availability of in person appointments. The patient expressed understanding and agreed to proceed. The patient and the provider were the only parties present for the visit unless noted in HPI below.  History of Present Illness: The patient is a 77 y.o. female with visit for covid-19 tested positive this morning. Started yesterday. Has joint pain and sore throat and chills.  Observations/Objective: Appearance: tired looking, breathing appears normal, 1 cough during visit, casual grooming,   Assessment and Plan: See problem oriented charting  Follow Up Instructions: rx paxlovid discussed quarantine advice  I discussed the assessment and treatment plan with the patient. The patient was provided an opportunity to ask questions and all were answered. The patient agreed with the plan and demonstrated an understanding of the instructions.   The patient was advised to call back or seek an in-person evaluation if the symptoms worsen or if the condition fails to improve as anticipated.  Myrlene Broker, MD

## 2022-09-14 NOTE — Assessment & Plan Note (Signed)
Rx paxlovid given age. Reviewed medication list for side effects. Her tacrolimus is capsule she then mixes into solution and uses topically on scalp so will not interfere. Counseled about otc products, quarantine guidelines.

## 2022-09-22 ENCOUNTER — Ambulatory Visit: Payer: Medicare Other | Admitting: Internal Medicine

## 2022-10-06 ENCOUNTER — Ambulatory Visit (INDEPENDENT_AMBULATORY_CARE_PROVIDER_SITE_OTHER): Payer: Medicare Other | Admitting: Internal Medicine

## 2022-10-06 ENCOUNTER — Encounter: Payer: Self-pay | Admitting: Internal Medicine

## 2022-10-06 VITALS — BP 120/86 | HR 64 | Temp 98.4°F | Ht 61.0 in | Wt 197.0 lb

## 2022-10-06 DIAGNOSIS — Z79899 Other long term (current) drug therapy: Secondary | ICD-10-CM

## 2022-10-06 DIAGNOSIS — Z23 Encounter for immunization: Secondary | ICD-10-CM | POA: Diagnosis not present

## 2022-10-06 DIAGNOSIS — E559 Vitamin D deficiency, unspecified: Secondary | ICD-10-CM | POA: Diagnosis not present

## 2022-10-06 DIAGNOSIS — L661 Lichen planopilaris: Secondary | ICD-10-CM

## 2022-10-06 DIAGNOSIS — L658 Other specified nonscarring hair loss: Secondary | ICD-10-CM | POA: Diagnosis not present

## 2022-10-06 DIAGNOSIS — E039 Hypothyroidism, unspecified: Secondary | ICD-10-CM

## 2022-10-06 DIAGNOSIS — J324 Chronic pansinusitis: Secondary | ICD-10-CM | POA: Diagnosis not present

## 2022-10-06 LAB — COMPREHENSIVE METABOLIC PANEL
ALT: 26 U/L (ref 0–35)
AST: 20 U/L (ref 0–37)
Albumin: 4.1 g/dL (ref 3.5–5.2)
Alkaline Phosphatase: 72 U/L (ref 39–117)
BUN: 16 mg/dL (ref 6–23)
CO2: 31 mEq/L (ref 19–32)
Calcium: 10 mg/dL (ref 8.4–10.5)
Chloride: 101 mEq/L (ref 96–112)
Creatinine, Ser: 0.84 mg/dL (ref 0.40–1.20)
GFR: 66.95 mL/min (ref >60.00)
Glucose, Bld: 103 mg/dL — ABNORMAL HIGH (ref 70–99)
Potassium: 4.7 mEq/L (ref 3.5–5.1)
Sodium: 137 mEq/L (ref 135–145)
Total Bilirubin: 0.3 mg/dL (ref 0.2–1.2)
Total Protein: 7.1 g/dL (ref 6.0–8.3)

## 2022-10-06 LAB — T4, FREE: Free T4: 1.09 ng/dL (ref 0.60–1.60)

## 2022-10-06 LAB — HEMOGLOBIN A1C: Hgb A1c MFr Bld: 5.8 % (ref 4.6–6.5)

## 2022-10-06 LAB — LIPID PANEL
Cholesterol: 238 mg/dL — ABNORMAL HIGH (ref 0–200)
HDL: 64.9 mg/dL (ref >39.00)
LDL Cholesterol: 147 mg/dL — ABNORMAL HIGH (ref 0–99)
NonHDL: 172.65
Total CHOL/HDL Ratio: 4
Triglycerides: 130 mg/dL (ref 0.0–149.0)
VLDL: 26 mg/dL (ref 0.0–40.0)

## 2022-10-06 LAB — CBC
HCT: 42.3 % (ref 36.0–46.0)
Hemoglobin: 13.8 g/dL (ref 12.0–15.0)
MCHC: 32.7 g/dL (ref 30.0–36.0)
MCV: 105.1 fl — ABNORMAL HIGH (ref 78.0–100.0)
Platelets: 278 10*3/uL (ref 150.0–400.0)
RBC: 4.03 Mil/uL (ref 3.87–5.11)
RDW: 14.4 % (ref 11.5–15.5)
WBC: 8.1 10*3/uL (ref 4.0–10.5)

## 2022-10-06 LAB — VITAMIN D 25 HYDROXY (VIT D DEFICIENCY, FRACTURES): VITD: 75.21 ng/mL (ref 30.00–100.00)

## 2022-10-06 LAB — TSH: TSH: 1.99 u[IU]/mL (ref 0.35–5.50)

## 2022-10-06 LAB — VITAMIN B12: Vitamin B-12: 1188 pg/mL — ABNORMAL HIGH (ref 211–911)

## 2022-10-06 MED ORDER — ALBUTEROL SULFATE HFA 108 (90 BASE) MCG/ACT IN AERS: 1.0000 | INHALATION_SPRAY | RESPIRATORY_TRACT | 3 refills | Status: DC | PRN

## 2022-10-06 MED ORDER — SYNTHROID 112 MCG PO TABS: 112.0000 ug | ORAL_TABLET | Freq: Every day | ORAL | 3 refills | Status: DC

## 2022-10-06 MED ORDER — BUDESONIDE 180 MCG/ACT IN AEPB: 1.0000 | INHALATION_SPRAY | Freq: Two times a day (BID) | RESPIRATORY_TRACT | 11 refills | Status: DC

## 2022-10-06 NOTE — Progress Notes (Unsigned)
   Subjective:   Patient ID: Melissa Craig, female    DOB: [birth year], 77 y.o.   MRN: [id number]  HPI The patient is a 77 YO female coming in for follow up.  Review of Systems  Objective:  Physical Exam  Vitals:   10/06/22 1345  BP: 120/86  Pulse: 64  Temp: 98.4 F (36.9 C)  TempSrc: Oral  SpO2: 98%  Weight: 197 lb (89.4 kg)  Height: 5\' 1"  (1.549 m)    Assessment & Plan:  Prevnar 20 given at visit

## 2022-10-06 NOTE — Patient Instructions (Addendum)
We will check the labs today.  Get flu and covid-9 late October.   We have given you the pneumonia 20 vaccine today.

## 2022-10-07 NOTE — Assessment & Plan Note (Signed)
Exacerbated by recent covid-19 and some symptoms of nose running and throat clearing still. Overall improving. Taking zyrtec and nasonex daily.

## 2022-10-07 NOTE — Assessment & Plan Note (Signed)
Checking TSH and free T4 and adjust synthroid 112 DAW as needed.

## 2022-10-07 NOTE — Assessment & Plan Note (Signed)
Getting clobetasol ointment and nystatin from gyn and informed we can prescribe if needed.

## 2022-10-07 NOTE — Assessment & Plan Note (Signed)
Taking methotrexate and prograf. Checking CBC, CMP, lipid panel, HgA1c and adjust as needed.

## 2022-10-07 NOTE — Assessment & Plan Note (Signed)
Taking finasteride and overall stable not significant improvement.

## 2022-10-27 DIAGNOSIS — L661 Lichen planopilaris: Secondary | ICD-10-CM | POA: Diagnosis not present

## 2022-10-27 DIAGNOSIS — L304 Erythema intertrigo: Secondary | ICD-10-CM | POA: Diagnosis not present

## 2022-10-27 DIAGNOSIS — Z5181 Encounter for therapeutic drug level monitoring: Secondary | ICD-10-CM | POA: Diagnosis not present

## 2022-12-09 DIAGNOSIS — Z23 Encounter for immunization: Secondary | ICD-10-CM | POA: Diagnosis not present

## 2023-01-26 DIAGNOSIS — L304 Erythema intertrigo: Secondary | ICD-10-CM | POA: Diagnosis not present

## 2023-03-22 DIAGNOSIS — Z779 Other contact with and (suspected) exposures hazardous to health: Secondary | ICD-10-CM | POA: Diagnosis not present

## 2023-03-22 DIAGNOSIS — Z01411 Encounter for gynecological examination (general) (routine) with abnormal findings: Secondary | ICD-10-CM | POA: Diagnosis not present

## 2023-03-22 DIAGNOSIS — Z1331 Encounter for screening for depression: Secondary | ICD-10-CM | POA: Diagnosis not present

## 2023-03-22 DIAGNOSIS — Z1231 Encounter for screening mammogram for malignant neoplasm of breast: Secondary | ICD-10-CM | POA: Diagnosis not present

## 2023-04-23 ENCOUNTER — Encounter: Payer: Self-pay | Admitting: Internal Medicine

## 2023-04-27 DIAGNOSIS — Z5181 Encounter for therapeutic drug level monitoring: Secondary | ICD-10-CM | POA: Diagnosis not present

## 2023-04-27 DIAGNOSIS — L304 Erythema intertrigo: Secondary | ICD-10-CM | POA: Diagnosis not present

## 2023-04-27 DIAGNOSIS — L661 Lichen planopilaris, unspecified: Secondary | ICD-10-CM | POA: Diagnosis not present

## 2023-04-27 DIAGNOSIS — M1711 Unilateral primary osteoarthritis, right knee: Secondary | ICD-10-CM | POA: Diagnosis not present

## 2023-05-10 ENCOUNTER — Encounter: Payer: Self-pay | Admitting: Internal Medicine

## 2023-05-10 ENCOUNTER — Ambulatory Visit (INDEPENDENT_AMBULATORY_CARE_PROVIDER_SITE_OTHER): Admitting: Internal Medicine

## 2023-05-10 VITALS — BP 130/74 | HR 55 | Temp 97.6°F | Ht 61.0 in | Wt 179.0 lb

## 2023-05-10 DIAGNOSIS — R7303 Prediabetes: Secondary | ICD-10-CM | POA: Insufficient documentation

## 2023-05-10 DIAGNOSIS — E782 Mixed hyperlipidemia: Secondary | ICD-10-CM | POA: Diagnosis not present

## 2023-05-10 LAB — LIPID PANEL
Cholesterol: 190 mg/dL (ref 0–200)
HDL: 65.3 mg/dL (ref >39.00)
LDL Cholesterol: 112 mg/dL — ABNORMAL HIGH (ref 0–99)
NonHDL: 125.19
Total CHOL/HDL Ratio: 3
Triglycerides: 64 mg/dL (ref 0.0–149.0)
VLDL: 12.8 mg/dL (ref 0.0–40.0)

## 2023-05-10 LAB — HEMOGLOBIN A1C: Hgb A1c MFr Bld: 5.7 % (ref 4.6–6.5)

## 2023-05-10 NOTE — Progress Notes (Signed)
   Subjective:   Patient ID: Melissa Craig, female    DOB: Apr 30, 1945, 78 y.o.   MRN: [id number]  HPI The patient is a 78 YO female coming in for diet changes and wishes to recheck cholesterol and sugars.   Review of Systems  Constitutional: Negative.   HENT: Negative.    Eyes: Negative.   Respiratory:  Negative for cough, chest tightness and shortness of breath.   Cardiovascular:  Negative for chest pain, palpitations and leg swelling.  Gastrointestinal:  Negative for abdominal distention, abdominal pain, constipation, diarrhea, nausea and vomiting.  Musculoskeletal: Negative.   Skin: Negative.   Neurological: Negative.   Psychiatric/Behavioral: Negative.      Objective:  Physical Exam Constitutional:      Appearance: She is well-developed.  HENT:     Head: Normocephalic and atraumatic.  Cardiovascular:     Rate and Rhythm: Normal rate and regular rhythm.  Pulmonary:     Effort: Pulmonary effort is normal. No respiratory distress.     Breath sounds: Normal breath sounds. No wheezing or rales.  Abdominal:     General: Bowel sounds are normal. There is no distension.     Palpations: Abdomen is soft.     Tenderness: There is no abdominal tenderness. There is no rebound.  Musculoskeletal:     Cervical back: Normal range of motion.  Skin:    General: Skin is warm and dry.  Neurological:     Mental Status: She is alert and oriented to person, place, and time.     Coordination: Coordination normal.     Vitals:   05/10/23 0807  BP: 130/74  Pulse: (!) 55  Temp: 97.6 F (36.4 C)  TempSrc: Temporal  SpO2: 98%  Weight: 179 lb (81.2 kg)  Height: 5\' 1"  (1.549 m)    Assessment & Plan:

## 2023-05-10 NOTE — Assessment & Plan Note (Signed)
 Checking HgA1c and adjust as needed. Has lost 20 pounds with diet and lifestyle changes since last visit.

## 2023-05-10 NOTE — Assessment & Plan Note (Signed)
 Checking lipid panel and adjust as needed. Down 20 pounds since last visit due to lifestyle changes.

## 2023-05-11 ENCOUNTER — Encounter: Payer: Self-pay | Admitting: Internal Medicine

## 2023-05-12 ENCOUNTER — Other Ambulatory Visit: Payer: Self-pay

## 2023-05-12 MED ORDER — SYNTHROID 112 MCG PO TABS: 112.0000 ug | ORAL_TABLET | Freq: Every day | ORAL | 1 refills | Status: DC

## 2023-05-25 DIAGNOSIS — M170 Bilateral primary osteoarthritis of knee: Secondary | ICD-10-CM | POA: Diagnosis not present

## 2023-07-09 ENCOUNTER — Encounter: Payer: Self-pay | Admitting: Internal Medicine

## 2023-07-09 ENCOUNTER — Ambulatory Visit: Payer: Self-pay | Admitting: *Deleted

## 2023-07-09 ENCOUNTER — Ambulatory Visit (INDEPENDENT_AMBULATORY_CARE_PROVIDER_SITE_OTHER): Admitting: Internal Medicine

## 2023-07-09 VITALS — BP 120/82 | HR 74 | Temp 98.1°F | Ht 61.0 in | Wt 182.0 lb

## 2023-07-09 DIAGNOSIS — J324 Chronic pansinusitis: Secondary | ICD-10-CM

## 2023-07-09 MED ORDER — DOXYCYCLINE HYCLATE 100 MG PO TABS: 100.0000 mg | ORAL_TABLET | Freq: Two times a day (BID) | ORAL | 0 refills | Status: DC

## 2023-07-09 MED ORDER — MOMETASONE FUROATE 50 MCG/ACT NA SUSP: 2.0000 | Freq: Every day | NASAL | 3 refills | Status: DC

## 2023-07-09 MED ORDER — BUDESONIDE 180 MCG/ACT IN AEPB: 1.0000 | INHALATION_SPRAY | Freq: Two times a day (BID) | RESPIRATORY_TRACT | 11 refills | Status: DC

## 2023-07-09 NOTE — Assessment & Plan Note (Signed)
 With acute flare and she is using otc allergy as well as prescribed nasonex which was refilled. Refilled pulmicort  which she will continue. Has albuterol  prn. Rx doxycycline which she will initiate in a few days if no improvement. She is on immunosuppressing agents which makes her higher risk of progression.

## 2023-07-09 NOTE — Telephone Encounter (Signed)
 Message from [place] sent at 07/09/2023  8:09 AM EDT  Copied From CRM [id number]. Reason for Triage: Possible sinus infection but getting worse. Callback number is [phone]    Call History  Contact Date/Time Type Contact Phone/Fax By  07/09/2023 08:06 AM EDT Phone (Incoming) Alyson Jump   Reason for Disposition  [1] Sinus pain (not just congestion) AND [2] fever  Answer Assessment - Initial Assessment Questions 1. LOCATION: "Where does it hurt?"      I think I have a sinus infection.   I'm having a headache, eyes puffy, and pressure.   I'm not blowing my nose a lot but I'm having a lot of post nasal drainage that is causing me to have coughing fits.   Not coughing up anything.    My throat is sore and irritated too. 2. ONSET: "When did the sinus pain start?"  (e.g., hours, days)      Started Monday.    3. SEVERITY: "How bad is the pain?"   (Scale 1-10; mild, moderate or severe)   - MILD (1-3): doesn't interfere with normal activities    - MODERATE (4-7): interferes with normal activities (e.g., work or school) or awakens from sleep   - SEVERE (8-10): excruciating pain and patient unable to do any normal activities        Moderate 4. RECURRENT SYMPTOM: "Have you ever had sinus problems before?" If Yes, ask: "When was the last time?" and "What happened that time?"      Yes but it's been over a year. 5. NASAL CONGESTION: "Is the nose blocked?" If Yes, ask: "Can you open it or must you breathe through your mouth?"     No but a lot of post nasal drip. 6. NASAL DISCHARGE: "Do you have discharge from your nose?" If so ask, "What color?"     No 7. FEVER: "Do you have a fever?" If Yes, ask: "What is it, how was it measured, and when did it start?"      No 8. OTHER SYMPTOMS: "Do you have any other symptoms?" (e.g., sore throat, cough, earache, difficulty breathing)     Sore throat, non productive cough, sinus pressure and congestion 9. PREGNANCY: "Is there any chance you  are pregnant?" "When was your last menstrual period?"     N/A due to age  Protocols used: Sinus Pain or Congestion-A-AH

## 2023-07-09 NOTE — Patient Instructions (Signed)
 We have sent in doxycycline to take if needed.

## 2023-07-09 NOTE — Telephone Encounter (Signed)
  Chief Complaint: Sinus infection  Symptoms: sinus congestion, a lot of post nasal drip, sore throat and coughing fits from post nasal drip.   Not getting anything out when she blows her nose or coughs.  Not getting better with nasal irrigation. Frequency: Symptoms started on Monday Pertinent Negatives: Patient denies fever or coughing anything up Disposition: [] ED /[] Urgent Care (no appt availability in office) / [x] Appointment(In office/virtual)/ []  [facility]/ [] Home Care/ [] Refused Recommended Disposition /[] [facility] [place] [facility]/ []  Follow-up with PCP Additional Notes: Appt made for today with Dr. Nicolette Barrio for 10:20.

## 2023-07-09 NOTE — Progress Notes (Signed)
   Subjective:   Patient ID: Melissa Craig, female    DOB: 16-Oct-1945, 78 y.o.   MRN: [id number]  Sinus Problem Associated symptoms include congestion, coughing, ear pain, headaches and sinus pressure. Pertinent negatives include no chills, shortness of breath, sneezing or sore throat.   The patient is a 78 YO female coming in for sinus symptoms 4-5 days. She has had sinus infections in the past. Doing nasonex, claritin, sinus rinses to no avail. Using pulmicort  daily and has tried albuterol  due to coughing.  Review of Systems  Constitutional:  Positive for activity change and appetite change. Negative for chills, fatigue, fever and unexpected weight change.  HENT:  Positive for congestion, ear pain, postnasal drip, rhinorrhea and sinus pressure. Negative for ear discharge, sinus pain, sneezing, sore throat, tinnitus, trouble swallowing and voice change.   Eyes: Negative.   Respiratory:  Positive for cough. Negative for chest tightness, shortness of breath and wheezing.   Cardiovascular: Negative.   Gastrointestinal: Negative.   Musculoskeletal:  Positive for myalgias.  Neurological:  Positive for headaches.    Objective:  Physical Exam Constitutional:      Appearance: She is well-developed.  HENT:     Head: Normocephalic and atraumatic.     Comments: Oropharynx with redness and clear drainage, nose with swollen turbinates, TMs normal bilaterally.  Neck:     Thyroid : No thyromegaly.  Cardiovascular:     Rate and Rhythm: Normal rate and regular rhythm.  Pulmonary:     Effort: Pulmonary effort is normal. No respiratory distress.     Breath sounds: Normal breath sounds. No wheezing or rales.  Abdominal:     Palpations: Abdomen is soft.  Musculoskeletal:        General: Tenderness present.     Cervical back: Normal range of motion.  Lymphadenopathy:     Cervical: No cervical adenopathy.  Skin:    General: Skin is warm and dry.  Neurological:     Mental Status: She is alert and  oriented to person, place, and time.     Vitals:   07/09/23 1006  BP: 120/82  Pulse: 74  Temp: 98.1 F (36.7 C)  TempSrc: Oral  SpO2: 97%  Weight: 182 lb (82.6 kg)  Height: 5\' 1"  (1.549 m)    Assessment & Plan:

## 2023-07-27 DIAGNOSIS — M170 Bilateral primary osteoarthritis of knee: Secondary | ICD-10-CM | POA: Diagnosis not present

## 2023-08-19 DIAGNOSIS — Z23 Encounter for immunization: Secondary | ICD-10-CM | POA: Diagnosis not present

## 2023-08-26 DIAGNOSIS — M1711 Unilateral primary osteoarthritis, right knee: Secondary | ICD-10-CM | POA: Diagnosis not present

## 2023-08-29 ENCOUNTER — Other Ambulatory Visit: Payer: Self-pay | Admitting: Medical Genetics

## 2023-09-01 DIAGNOSIS — H43813 Vitreous degeneration, bilateral: Secondary | ICD-10-CM | POA: Diagnosis not present

## 2023-09-01 DIAGNOSIS — Z961 Presence of intraocular lens: Secondary | ICD-10-CM | POA: Diagnosis not present

## 2023-09-01 DIAGNOSIS — H04123 Dry eye syndrome of bilateral lacrimal glands: Secondary | ICD-10-CM | POA: Diagnosis not present

## 2023-09-01 DIAGNOSIS — H35362 Drusen (degenerative) of macula, left eye: Secondary | ICD-10-CM | POA: Diagnosis not present

## 2023-09-02 ENCOUNTER — Other Ambulatory Visit

## 2023-09-02 DIAGNOSIS — Z006 Encounter for examination for normal comparison and control in clinical research program: Secondary | ICD-10-CM

## 2023-09-03 ENCOUNTER — Ambulatory Visit

## 2023-09-03 VITALS — BP 110/78 | HR 52 | Ht 60.75 in | Wt 177.8 lb

## 2023-09-03 DIAGNOSIS — Z0000 Encounter for general adult medical examination without abnormal findings: Secondary | ICD-10-CM

## 2023-09-03 NOTE — Patient Instructions (Signed)
 [name] , Thank you for taking time out of your busy schedule to complete your Annual Wellness Visit with me. I enjoyed our conversation and look forward to speaking with you again next year. I, as well as your care team,  appreciate your ongoing commitment to your health goals. Please review the following plan we discussed and let me know if I can assist you in the future. Your Game plan/ To Do List   Follow up Visits: Next Medicare AWV with our clinical staff: 09/06/2024.   Have you seen your provider in the last 6 months (3 months if uncontrolled diabetes)? Yes Next Office Visit with your provider: Patient will call office to schedule her next office visit.  Last on 07/09/23.  Clinician Recommendations:  Aim for 30 minutes of exercise or brisk walking, 6-8 glasses of water, and 5 servings of fruits and vegetables each day.       This is a list of the screening recommended for you and due dates:  Health Maintenance  Topic Date Due   Hepatitis C Screening  Never done   DTaP/Tdap/Td vaccine (1 - Tdap) Never done   Zoster (Shingles) Vaccine (1 of 2) 03/02/1964   COVID-19 Vaccine (4 - 2024-25 season) 10/18/2022   Flu Shot  09/17/2023   Medicare Annual Wellness Visit  09/02/2024   Pneumococcal Vaccine for age over 47  Completed   DEXA scan (bone density measurement)  Completed   Hepatitis B Vaccine  Aged Out   HPV Vaccine  Aged Out   Meningitis B Vaccine  Aged Out    Advanced directives: (In Chart) A copy of your advanced directives are scanned into your chart should your provider ever need it. Advance Care Planning is important because it:  [x]  Makes sure you receive the medical care that is consistent with your values, goals, and preferences  [x]  It provides guidance to your family and loved ones and reduces their decisional burden about whether or not they are making the right decisions based on your wishes.  Follow the link provided in your after visit summary or read over the  paperwork we have mailed to you to help you started getting your Advance Directives in place. If you need assistance in completing these, please reach out to us  so that we can help you!  See attachments for Preventive Care and Fall Prevention Tips.

## 2023-09-03 NOTE — Progress Notes (Signed)
 Subjective:   Melissa Craig is a 78 y.o. who presents for a Medicare Wellness preventive visit.  As a reminder, Annual Wellness Visits don't include a physical exam, and some assessments may be limited, especially if this visit is performed virtually. We may recommend an in-person follow-up visit with your provider if needed.  Visit Complete: In person  Persons Participating in Visit: Patient.  AWV Questionnaire: Yes: Patient Medicare AWV questionnaire was completed by the patient on 08/30/2023; I have confirmed that all information answered by patient is correct and no changes since this date.  Cardiac Risk Factors include: advanced age (>43men, >67 women);dyslipidemia     Objective:    Today's Vitals   08/30/23 1146 09/03/23 1336  BP:  110/78  Pulse:  (!) 52  SpO2:  100%  Weight:  177 lb 12.8 oz (80.6 kg)  Height:  5' 0.75 (1.543 m)  PainSc: 5     Body mass index is 33.87 kg/m.     09/03/2023    1:29 PM 08/19/2022   11:28 AM 10/02/2014   10:40 AM  Advanced Directives  Does Patient Have a Medical Advance Directive? Yes Yes No   Type of Estate agent of [place];Living will Healthcare Power of [place];Living will   Does patient want to make changes to medical advance directive? No - Patient declined No - Patient declined   Copy of Healthcare Power of Attorney in Chart? Yes - validated most recent copy scanned in chart (See row information) Yes - validated most recent copy scanned in chart (See row information)   Would patient like information on creating a medical advance directive?   No - patient declined information      Data saved with a previous flowsheet row definition    Current Medications (verified) Outpatient Encounter Medications as of 09/03/2023  Medication Sig   albuterol  (VENTOLIN  HFA) 108 (90 Base) MCG/ACT inhaler Inhale 1-2 puffs into the lungs every 4 (four) hours as needed for wheezing or shortness of breath.   budesonide   (PULMICORT ) 180 MCG/ACT inhaler Inhale 1 puff into the lungs 2 (two) times daily.   calcium citrate-vitamin D  (CITRACAL+D) 315-200 MG-UNIT per tablet Take 1 tablet by mouth 2 (two) times daily.   cetirizine (ZYRTEC) 10 MG tablet Take 10 mg by mouth daily.   cholecalciferol (VITAMIN D ) 1000 UNITS tablet Take 1,000 Units by mouth daily.   clobetasol cream (TEMOVATE) 0.05 % SMARTSIG:Sparingly Topical Twice a Week   doxycycline  (VIBRA -TABS) 100 MG tablet Take 1 tablet (100 mg total) by mouth 2 (two) times daily.   EPINEPHrine (EPIPEN JR) 0.15 MG/0.3ML injection Inject into the muscle.   estradiol (ESTRACE) 0.1 MG/GM vaginal cream SMARTSIG:1 Applicator Vaginal Once a Week   finasteride (PROSCAR) 5 MG tablet Take 5 mg by mouth daily.   folic acid  (FOLVITE ) 1 MG tablet    methotrexate (RHEUMATREX) 2.5 MG tablet    mometasone  (NASONEX ) 50 MCG/ACT nasal spray Place 2 sprays into the nose daily.   nystatin cream (MYCOSTATIN) Apply 2 times a day to affected areas   SYNTHROID  112 MCG tablet Take 1 tablet (112 mcg total) by mouth daily before breakfast.   tacrolimus (PROGRAF) 1 MG capsule daily as needed.   valACYclovir (VALTREX) 500 MG tablet    No facility-administered encounter medications on file as of 09/03/2023.    Allergies (verified) Gentamycin [gentamicin], Cat dander, Ceftin [cefuroxime axetil], Ciprocinonide [fluocinolone], Ampicillin, Ciprofloxacin, Other, Penicillins, Pollen extract, Sulfa antibiotics, and Sulfasalazine   History: Past Medical History:  Diagnosis Date   Arthritis    Asthma    Bulging lumbar disc    Lichen plano-pilaris    Lichen planus    Lichen sclerosus    Thyroid  disease    Past Surgical History:  Procedure Laterality Date   DILATION AND CURETTAGE OF UTERUS     SINUSOTOMY     History reviewed. No pertinent family history. Social History   Socioeconomic History   Marital status: Divorced    Spouse name: Not on file   Number of children: Not on file    Years of education: Not on file   Highest education level: Bachelor's degree (e.g., BA, AB, BS)  Occupational History   Occupation: RETIRED  Tobacco Use   Smoking status: Former   Smokeless tobacco: Not on file   Tobacco comments:    Stopped in 1986  Vaping Use   Vaping status: Never Used  Substance and Sexual Activity   Alcohol use: Yes    Alcohol/week: 7.0 standard drinks of alcohol    Types: 7 Glasses of wine per week   Drug use: Never   Sexual activity: Not on file  Other Topics Concern   Not on file  Social History Narrative   Lives alone/2025   Social Drivers of Health   Financial Resource Strain: Low Risk  (08/30/2023)   Overall Financial Resource Strain (CARDIA)    Difficulty of Paying Living Expenses: Not hard at all  Food Insecurity: No Food Insecurity (08/30/2023)   Hunger Vital Sign    Worried About Running Out of Food in the Last Year: Never true    Ran Out of Food in the Last Year: Never true  Transportation Needs: No Transportation Needs (08/30/2023)   PRAPARE - Administrator, Civil Service (Medical): No    Lack of Transportation (Non-Medical): No  Physical Activity: Sufficiently Active (08/30/2023)   Exercise Vital Sign    Days of Exercise per Week: 4 days    Minutes of Exercise per Session: 50 min  Stress: No Stress Concern Present (08/30/2023)   [employer] of Occupational Health - Occupational Stress Questionnaire    Feeling of Stress: Only a little  Social Connections: Moderately Isolated (08/30/2023)   Social Connection and Isolation Panel    Frequency of Communication with Friends and Family: More than three times a week    Frequency of Social Gatherings with Friends and Family: Twice a week    Attends Religious Services: Never    Database administrator or Organizations: Yes    Attends Engineer, structural: 1 to 4 times per year    Marital Status: Divorced    Tobacco Counseling Counseling given: Not Answered Tobacco  comments: Stopped in 1986    Clinical Intake:  Pre-visit preparation completed: Yes  Pain : 0-10 Pain Score: 5  Pain Type: Chronic pain Pain Location: Hip Pain Orientation: Right Pain Descriptors / Indicators: Aching, Discomfort Pain Onset: More than a month ago Pain Frequency: Constant Pain Relieving Factors: Tylenol, 500 mg twice a day  Pain Relieving Factors: Tylenol, 500 mg twice a day  BMI - recorded: 33.87 Nutritional Status: BMI > 30  Obese Nutritional Risks: None Diabetes: No  Lab Results  Component Value Date   HGBA1C 5.7 05/10/2023   HGBA1C 5.8 10/06/2022     How often do you need to have someone help you when you read instructions, pamphlets, or other written materials from your doctor or pharmacy?: 1 - Never  Information entered by :: Barak Bialecki, RMA   Activities of Daily Living     08/30/2023   11:46 AM  In your present state of health, do you have any difficulty performing the following activities:  Hearing? 1  Comment wears hearing aides  Vision? 0  Difficulty concentrating or making decisions? 0  Walking or climbing stairs? 0  Dressing or bathing? 0  Doing errands, shopping? 0  Preparing Food and eating ? N  Using the Toilet? N  In the past six months, have you accidently leaked urine? N  Do you have problems with loss of bowel control? N  Managing your Medications? N  Managing your Finances? N  Housekeeping or managing your Housekeeping? N    Patient Care Team: Rollene Almarie LABOR, MD as PCP - General (Internal Medicine) [facility] Ceasar Shaver, MD as Referring Physician (Dermatology)  I have updated your Care Teams any recent Medical Services you may have received from other providers in the past year.     Assessment:   This is a routine wellness examination for Melissa Craig.  Hearing/Vision screen Hearing Screening - Comments:: wears hearing aides Vision Screening - Comments:: Denies vision  issues.    Goals Addressed               This Visit's Progress     Patient Stated (pt-stated)        Continue to maintain health and lose some weight/2025       Depression Screen     09/03/2023    1:48 PM 05/10/2023    8:12 AM 09/14/2022   10:56 AM 08/19/2022   11:34 AM  PHQ 2/9 Scores  PHQ - 2 Score 0 0 0 0  PHQ- 9 Score 0  0 0    Fall Risk     08/30/2023   11:46 AM 07/09/2023   10:14 AM 05/10/2023    8:12 AM 10/06/2022    1:50 PM 09/14/2022   10:56 AM  Fall Risk   Falls in the past year? 1 1 0 0 0  Number falls in past yr: 0 0 0 0 0  Injury with Fall? 0 1 0 0 0  Risk for fall due to :   No Fall Risks    Follow up Falls evaluation completed;Falls prevention discussed Falls evaluation completed Falls evaluation completed Falls evaluation completed Falls evaluation completed    MEDICARE RISK AT HOME:  Medicare Risk at Home Any stairs in or around the home?: (Patient-Rptd) Yes If so, are there any without handrails?: (Patient-Rptd) No Home free of loose throw rugs in walkways, pet beds, electrical cords, etc?: (Patient-Rptd) Yes Adequate lighting in your home to reduce risk of falls?: (Patient-Rptd) Yes Life alert?: (Patient-Rptd) No Use of a cane, walker or w/c?: (Patient-Rptd) No Grab bars in the bathroom?: (Patient-Rptd) No Shower chair or bench in shower?: (Patient-Rptd) Yes Elevated toilet seat or a handicapped toilet?: (Patient-Rptd) Yes  TIMED UP AND GO:  Was the test performed?  Yes  Length of time to ambulate 10 feet: 15 sec Gait steady and fast without use of assistive device  Cognitive Function: Declined/Normal: No cognitive concerns noted by patient or family. Patient alert, oriented, able to answer questions appropriately and recall recent events. No signs of memory loss or confusion.        08/19/2022   11:37 AM  6CIT Screen  What Year? 0 points  What month? 0 points  What time? 0 points  Count back from  20 0 points  Months in reverse 0 points   Repeat phrase 0 points  Total Score 0 points    Immunizations Immunization History  Administered Date(s) Administered   Influenza, High Dose Seasonal PF 11/12/2021   PFIZER(Purple Top)SARS-COV-2 Vaccination 03/23/2019, 04/17/2019   PNEUMOCOCCAL CONJUGATE-20 10/06/2022    Screening Tests Health Maintenance  Topic Date Due   Hepatitis C Screening  Never done   DTaP/Tdap/Td (1 - Tdap) Never done   Zoster Vaccines- Shingrix (1 of 2) 03/02/1964   COVID-19 Vaccine (4 - 2024-25 season) 10/18/2022   INFLUENZA VACCINE  09/17/2023   Medicare Annual Wellness (AWV)  09/02/2024   Pneumococcal Vaccine: 50+ Years  Completed   DEXA SCAN  Completed   Hepatitis B Vaccines  Aged Out   HPV VACCINES  Aged Out   Meningococcal B Vaccine  Aged Out    Health Maintenance  Health Maintenance Due  Topic Date Due   Hepatitis C Screening  Never done   DTaP/Tdap/Td (1 - Tdap) Never done   Zoster Vaccines- Shingrix (1 of 2) 03/02/1964   COVID-19 Vaccine (4 - 2024-25 season) 10/18/2022   Health Maintenance Items Addressed: See Nurse Notes at the end of this note  Additional Screening:  Vision Screening: Recommended annual ophthalmology exams for early detection of glaucoma and other disorders of the eye. Would you like a referral to an eye doctor? No    Dental Screening: Recommended annual dental exams for proper oral hygiene  Community Resource Referral / Chronic Care Management: CRR required this visit?  No   CCM required this visit?  Appt scheduled with PCP   Plan:    I have personally reviewed and noted the following in the patient's chart:   Medical and social history Use of alcohol, tobacco or illicit drugs  Current medications and supplements including opioid prescriptions. Patient is not currently taking opioid prescriptions. Functional ability and status Nutritional status Physical activity Advanced directives List of other physicians Hospitalizations, surgeries, and ER  visits in previous 12 months Vitals Screenings to include cognitive, depression, and falls Referrals and appointments  In addition, I have reviewed and discussed with patient certain preventive protocols, quality metrics, and best practice recommendations. A written personalized care plan for preventive services as well as general preventive health recommendations were provided to patient.   Zamarian Scarano L Roxana Lai, CMA   09/03/2023   After Visit Summary: (MyChart) Due to this being a telephonic visit, the after visit summary with patients personalized plan was offered to patient via MyChart   Notes: Patient is up to date with all health maintenance.  Patient declines the Tdap vaccine for now.  She stated that she would a lab for her B12 during her next office with Dr. Rollene.

## 2023-09-13 LAB — GENECONNECT MOLECULAR SCREEN: Genetic Analysis Overall Interpretation: NEGATIVE

## 2023-09-24 DIAGNOSIS — H579 Unspecified disorder of eye and adnexa: Secondary | ICD-10-CM | POA: Diagnosis not present

## 2023-09-24 DIAGNOSIS — H0520 Unspecified exophthalmos: Secondary | ICD-10-CM | POA: Diagnosis not present

## 2023-09-24 DIAGNOSIS — R4689 Other symptoms and signs involving appearance and behavior: Secondary | ICD-10-CM | POA: Diagnosis not present

## 2023-09-24 DIAGNOSIS — E063 Autoimmune thyroiditis: Secondary | ICD-10-CM | POA: Diagnosis not present

## 2023-09-30 DIAGNOSIS — M25561 Pain in right knee: Secondary | ICD-10-CM | POA: Diagnosis not present

## 2023-09-30 DIAGNOSIS — M1611 Unilateral primary osteoarthritis, right hip: Secondary | ICD-10-CM | POA: Diagnosis not present

## 2023-10-08 ENCOUNTER — Encounter: Payer: Self-pay | Admitting: Internal Medicine

## 2023-10-08 ENCOUNTER — Ambulatory Visit (INDEPENDENT_AMBULATORY_CARE_PROVIDER_SITE_OTHER): Admitting: Internal Medicine

## 2023-10-08 VITALS — BP 118/80 | HR 56 | Temp 97.9°F | Ht 60.75 in | Wt 175.1 lb

## 2023-10-08 DIAGNOSIS — E559 Vitamin D deficiency, unspecified: Secondary | ICD-10-CM | POA: Diagnosis not present

## 2023-10-08 DIAGNOSIS — E039 Hypothyroidism, unspecified: Secondary | ICD-10-CM

## 2023-10-08 DIAGNOSIS — E782 Mixed hyperlipidemia: Secondary | ICD-10-CM

## 2023-10-08 DIAGNOSIS — R7303 Prediabetes: Secondary | ICD-10-CM | POA: Diagnosis not present

## 2023-10-08 DIAGNOSIS — B351 Tinea unguium: Secondary | ICD-10-CM | POA: Diagnosis not present

## 2023-10-08 DIAGNOSIS — Z1211 Encounter for screening for malignant neoplasm of colon: Secondary | ICD-10-CM

## 2023-10-08 LAB — T4, FREE: Free T4: 1.09 ng/dL (ref 0.60–1.60)

## 2023-10-08 LAB — COMPREHENSIVE METABOLIC PANEL WITH GFR
ALT: 14 U/L (ref 0–35)
AST: 16 U/L (ref 0–37)
Albumin: 4.1 g/dL (ref 3.5–5.2)
Alkaline Phosphatase: 61 U/L (ref 39–117)
BUN: 13 mg/dL (ref 6–23)
CO2: 26 meq/L (ref 19–32)
Calcium: 9.4 mg/dL (ref 8.4–10.5)
Chloride: 103 meq/L (ref 96–112)
Creatinine, Ser: 0.8 mg/dL (ref 0.40–1.20)
GFR: 70.48 mL/min (ref >60.00)
Glucose, Bld: 87 mg/dL (ref 70–99)
Potassium: 4.3 meq/L (ref 3.5–5.1)
Sodium: 139 meq/L (ref 135–145)
Total Bilirubin: 0.6 mg/dL (ref 0.2–1.2)
Total Protein: 7.1 g/dL (ref 6.0–8.3)

## 2023-10-08 LAB — LIPID PANEL
Cholesterol: 191 mg/dL (ref 0–200)
HDL: 69.6 mg/dL (ref >39.00)
LDL Cholesterol: 108 mg/dL — ABNORMAL HIGH (ref 0–99)
NonHDL: 121.14
Total CHOL/HDL Ratio: 3
Triglycerides: 67 mg/dL (ref 0.0–149.0)
VLDL: 13.4 mg/dL (ref 0.0–40.0)

## 2023-10-08 LAB — CBC
HCT: 42.4 % (ref 36.0–46.0)
Hemoglobin: 14 g/dL (ref 12.0–15.0)
MCHC: 33 g/dL (ref 30.0–36.0)
MCV: 103 fl — ABNORMAL HIGH (ref 78.0–100.0)
Platelets: 239 K/uL (ref 150.0–400.0)
RBC: 4.12 Mil/uL (ref 3.87–5.11)
RDW: 14.3 % (ref 11.5–15.5)
WBC: 6.4 K/uL (ref 4.0–10.5)

## 2023-10-08 LAB — HEMOGLOBIN A1C: Hgb A1c MFr Bld: 5.8 % (ref 4.6–6.5)

## 2023-10-08 LAB — VITAMIN D 25 HYDROXY (VIT D DEFICIENCY, FRACTURES): VITD: 78.64 ng/mL (ref 30.00–100.00)

## 2023-10-08 LAB — TSH: TSH: 0.91 u[IU]/mL (ref 0.35–5.50)

## 2023-10-08 MED ORDER — ALBUTEROL SULFATE HFA 108 (90 BASE) MCG/ACT IN AERS: 1.0000 | INHALATION_SPRAY | RESPIRATORY_TRACT | 3 refills | Status: AC | PRN

## 2023-10-08 MED ORDER — CICLOPIROX 8 % EX SOLN: Freq: Every day | CUTANEOUS | 0 refills | Status: AC

## 2023-10-08 MED ORDER — CLOTRIMAZOLE-BETAMETHASONE 1-0.05 % EX CREA: 1.0000 | TOPICAL_CREAM | Freq: Every day | CUTANEOUS | 0 refills | Status: AC

## 2023-10-08 MED ORDER — SYNTHROID 112 MCG PO TABS: 112.0000 ug | ORAL_TABLET | Freq: Every day | ORAL | 3 refills | Status: AC

## 2023-10-08 MED ORDER — MOMETASONE FUROATE 50 MCG/ACT NA SUSP: 2.0000 | Freq: Every day | NASAL | 3 refills | Status: AC

## 2023-10-08 NOTE — Assessment & Plan Note (Signed)
 Checking HGA1c and adjust as needed.

## 2023-10-08 NOTE — Assessment & Plan Note (Signed)
 Chronic toenail fungus on the second toe of the left foot is managed with Listerine soaks.Continue Listerine soaks and prescribe topical antifungal lacquer.

## 2023-10-08 NOTE — Progress Notes (Signed)
 Subjective:   Patient ID: Melissa Craig, female    DOB: 1945/04/19, 78 y.o.   MRN: [id number]  Discussed the use of AI scribe software for clinical note transcription with the patient, who gave verbal consent to proceed.  History of Present Illness Melissa Craig is a 78 year old female with arthritis who presents with pain management concerns.  She has been recently diagnosed with arthritis, experiencing significant and persistent pain, particularly in her right knee. She manages the pain with acetaminophen, 500 mg twice daily, which provides some relief. She is concerned about the long-term effects of acetaminophen on her liver, especially given her sister's history of liver damage from painkillers. She receives steroid injections in her knee and hip to manage pain before an upcoming trip. 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  She has a history of migraines and notes an increase in the frequency of migraine auras, although she does not experience the headaches themselves. She is monitoring potential triggers such as barometric pressure changes.  She experiences neuropathy in the second and third toes of her right foot, which is intermittent and position-dependent.  She has been dealing with a persistent fungal infection in her groin area for several months. She has tried nystatin and clobetasol without success and is seeking alternative treatments. She also has toenail fungus on her left foot, which she has been treating with Listerine soaks, although it has not resolved completely.  She is planning to undergo plastic surgery for her eye bags in January, as she is dissatisfied with their appearance.  Review of Systems  Constitutional: Negative.   HENT: Negative.    Eyes: Negative.   Respiratory:  Negative for cough, chest tightness and shortness of breath.   Cardiovascular:  Negative for  chest pain, palpitations and leg swelling.  Gastrointestinal:  Negative for abdominal distention, abdominal pain, constipation, diarrhea, nausea and vomiting.  Musculoskeletal:  Positive for arthralgias and myalgias.  Skin: Negative.   Neurological: Negative.   Psychiatric/Behavioral: Negative.      Objective:  Physical Exam Constitutional:      Appearance: She is well-developed.  HENT:     Head: Normocephalic and atraumatic.  Cardiovascular:     Rate and Rhythm: Normal rate and regular rhythm.  Pulmonary:     Effort: Pulmonary effort is normal. No respiratory distress.     Breath sounds: Normal breath sounds. No wheezing or rales.  Abdominal:     General: Bowel sounds are normal. There is no distension.     Palpations: Abdomen is soft.     Tenderness: There is no abdominal tenderness. There is no rebound.  Musculoskeletal:        General: Tenderness present.     Cervical back: Normal range of motion.  Skin:    General: Skin is warm and dry.  Neurological:     Mental Status: She is alert and oriented to person, place, and time.     Coordination: Coordination normal.     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 She  prefers to avoid opioids and tramadol unless necessary. Acetaminophen is safe up to 3000 mg daily. Administer steroid injections in the right knee and hip before travel. Continue acetaminophen, increasing to 1000 mg twice daily if needed. Avoid opioids unless pain becomes life-limiting.  Onychomycosis of toes   Chronic toenail fungus on the second toe of the left foot is managed with Listerine soaks.Continue Listerine soaks and prescribe topical  antifungal lacquer.   Peripheral neuropathy of right foot, second and third toes   Intermittent neuropathy in the second and third toes of the right foot is alleviated by repositioning.  General Health Maintenance   She is up to date on pneumonia and COVID vaccinations and plans for a flu shot. Shingles vaccination records need correction. Colon cancer screening options were discussed, with a preference for at-home testing ordered cologuard. Receive a flu shot in late September or early October. Correct shingles vaccination records in the system.

## 2023-10-08 NOTE — Assessment & Plan Note (Signed)
 Checking TSH and adjust as needed synthroid .

## 2023-10-08 NOTE — Assessment & Plan Note (Signed)
Checking lipid panel and adjust as needed not on meds.

## 2023-10-11 ENCOUNTER — Ambulatory Visit: Payer: Self-pay | Admitting: Internal Medicine

## 2023-10-11 DIAGNOSIS — R195 Other fecal abnormalities: Secondary | ICD-10-CM

## 2023-10-18 DIAGNOSIS — Z1211 Encounter for screening for malignant neoplasm of colon: Secondary | ICD-10-CM | POA: Diagnosis not present

## 2023-10-21 LAB — COLOGUARD: COLOGUARD: POSITIVE — AB

## 2023-10-22 ENCOUNTER — Other Ambulatory Visit: Payer: Self-pay | Admitting: Internal Medicine

## 2023-10-22 LAB — COLOGUARD: Cologuard: POSITIVE — AB

## 2023-10-22 NOTE — Progress Notes (Signed)
 Patient was able to review results and also sent a my chart message to the provider as well.

## 2023-10-22 NOTE — Telephone Encounter (Signed)
 FYI

## 2023-10-27 DIAGNOSIS — R195 Other fecal abnormalities: Secondary | ICD-10-CM | POA: Diagnosis not present

## 2023-10-29 DIAGNOSIS — Z5181 Encounter for therapeutic drug level monitoring: Secondary | ICD-10-CM | POA: Diagnosis not present

## 2023-10-29 DIAGNOSIS — L661 Lichen planopilaris, unspecified: Secondary | ICD-10-CM | POA: Diagnosis not present

## 2023-10-29 DIAGNOSIS — L304 Erythema intertrigo: Secondary | ICD-10-CM | POA: Diagnosis not present

## 2023-11-04 DIAGNOSIS — M1611 Unilateral primary osteoarthritis, right hip: Secondary | ICD-10-CM | POA: Diagnosis not present

## 2023-11-07 DIAGNOSIS — Z23 Encounter for immunization: Secondary | ICD-10-CM | POA: Diagnosis not present

## 2023-11-11 DIAGNOSIS — D124 Benign neoplasm of descending colon: Secondary | ICD-10-CM | POA: Diagnosis not present

## 2023-11-11 DIAGNOSIS — Z09 Encounter for follow-up examination after completed treatment for conditions other than malignant neoplasm: Secondary | ICD-10-CM | POA: Diagnosis not present

## 2023-11-11 DIAGNOSIS — K5730 Diverticulosis of large intestine without perforation or abscess without bleeding: Secondary | ICD-10-CM | POA: Diagnosis not present

## 2023-11-11 DIAGNOSIS — D125 Benign neoplasm of sigmoid colon: Secondary | ICD-10-CM | POA: Diagnosis not present

## 2023-11-11 DIAGNOSIS — Z860101 Personal history of adenomatous and serrated colon polyps: Secondary | ICD-10-CM | POA: Diagnosis not present

## 2023-11-11 LAB — HM COLONOSCOPY

## 2023-11-15 DIAGNOSIS — D124 Benign neoplasm of descending colon: Secondary | ICD-10-CM | POA: Diagnosis not present

## 2023-11-15 DIAGNOSIS — D125 Benign neoplasm of sigmoid colon: Secondary | ICD-10-CM | POA: Diagnosis not present

## 2023-11-16 DIAGNOSIS — M1712 Unilateral primary osteoarthritis, left knee: Secondary | ICD-10-CM | POA: Diagnosis not present

## 2024-03-06 ENCOUNTER — Encounter: Payer: Self-pay | Admitting: Internal Medicine

## 2024-03-06 ENCOUNTER — Ambulatory Visit (INDEPENDENT_AMBULATORY_CARE_PROVIDER_SITE_OTHER): Admitting: Internal Medicine

## 2024-03-06 VITALS — BP 120/70 | HR 79 | Temp 98.6°F | Ht 60.75 in | Wt 183.4 lb

## 2024-03-06 DIAGNOSIS — J4531 Mild persistent asthma with (acute) exacerbation: Secondary | ICD-10-CM

## 2024-03-06 MED ORDER — DOXYCYCLINE HYCLATE 100 MG PO TABS: 100.0000 mg | ORAL_TABLET | Freq: Two times a day (BID) | ORAL | 0 refills | Status: AC

## 2024-03-06 MED ORDER — PREDNISONE 20 MG PO TABS: 40.0000 mg | ORAL_TABLET | Freq: Every day | ORAL | 0 refills | Status: AC

## 2024-03-06 MED ORDER — BUDESONIDE 180 MCG/ACT IN AEPB: 1.0000 | INHALATION_SPRAY | Freq: Two times a day (BID) | RESPIRATORY_TRACT | 11 refills | Status: AC

## 2024-03-06 NOTE — Patient Instructions (Signed)
 We have sent in doxycycline  which is the antibiotic to take 1 pill twice a day for 1 week.  We have also sent in prednisone  to take 2 pills daily for 5 days.

## 2024-03-06 NOTE — Progress Notes (Unsigned)
 "  Subjective:   Patient ID: Melissa Craig, female    DOB: [birth year], 79 y.o.   MRN: [id number]  Discussed the use of AI scribe software for clinical note transcription with the patient, who gave verbal consent to proceed. History of Present Illness Melissa Craig is a 79 year old female who presents with persistent respiratory symptoms including cough, congestion, and fever.  She has been experiencing body aches, headache, congestion, cough, and cold symptoms for the past week. She tested negative for COVID-19 and suspects she may have contracted the flu while traveling in [place]. Her symptoms have been persistent with no significant improvement, and she has experienced intermittent fevers and chills. Notably, she had night sweats two nights ago.  Her congestion is significant, with little relief from blowing her nose. She uses a sinus rinse, which provides some help, but not significantly. She also feels chest congestion and has used albuterol  three to five times, which provides some relief but does not fully alleviate the sensation of congestion.  She occasionally uses a numbing throat spray, especially after an incident where pepper irritated her throat, causing persistent coughing. Her ears feel clogged, affecting her hearing, although they are clear of wax.  Her current medications include albuterol  which she is using more often and Pulmicort , and she has used a throat spray for symptomatic relief. She is concerned about her symptoms affecting her upcoming eye bag surgery scheduled for January 27th.  Review of Systems  Constitutional:  Positive for activity change, appetite change and chills. Negative for fatigue, fever and unexpected weight change.  HENT:  Positive for congestion, postnasal drip, rhinorrhea and sinus pressure. Negative for ear discharge, ear pain, sinus pain, sneezing, sore throat, tinnitus, trouble swallowing and voice change.   Eyes: Negative.   Respiratory:  Positive  for cough, shortness of breath and wheezing. Negative for chest tightness.   Cardiovascular: Negative.   Gastrointestinal: Negative.   Musculoskeletal:  Positive for myalgias.  Neurological: Negative.     Objective:  Physical Exam Constitutional:      Appearance: She is well-developed.  HENT:     Head: Normocephalic and atraumatic.     Comments: Oropharynx with redness and clear drainage, nose with swollen turbinates, TMs normal bilaterally.  Neck:     Thyroid : No thyromegaly.  Cardiovascular:     Rate and Rhythm: Normal rate and regular rhythm.  Pulmonary:     Effort: Pulmonary effort is normal. No respiratory distress.     Breath sounds: Wheezing and rhonchi present. No rales.  Abdominal:     Palpations: Abdomen is soft.  Musculoskeletal:        General: Tenderness present.     Cervical back: Normal range of motion.  Lymphadenopathy:     Cervical: No cervical adenopathy.  Skin:    General: Skin is warm and dry.  Neurological:     Mental Status: She is alert and oriented to person, place, and time.     Vitals:   03/06/24 1602  BP: 120/70  Pulse: 79  Temp: 98.6 F (37 C)  TempSrc: Oral  SpO2: 97%  Weight: 183 lb 6.4 oz (83.2 kg)  Height: 5' 0.75 (1.543 m)    Assessment and Plan Assessment & Plan Acute flare of asthma   Influenza is suspected as origin with secondary bacterial infection. She is using albuterol  more often and will continue pulmicort . Asthma is mild intermittent with flare today. Rhonchi present in the lungs. Treatment with antibiotics and steroids is necessary. Prescribe doxycycline   100 mg orally twice daily for 7 days, with the option to discontinue early if symptoms resolve by day 5. Prescribe prednisone , 2 tablets orally once daily for 5 days. Advise the use of probiotics to mitigate potential digestive side effects of antibiotics. Continue Pulmicort  as needed for respiratory symptoms.   "

## 2024-03-07 DIAGNOSIS — J4530 Mild persistent asthma, uncomplicated: Secondary | ICD-10-CM | POA: Insufficient documentation

## 2024-09-06 ENCOUNTER — Ambulatory Visit

## 2024-10-10 ENCOUNTER — Encounter: Admitting: Internal Medicine
